# Patient Record
Sex: Male | Born: 1990 | Race: Black or African American | Hispanic: No | Marital: Single | Smoking: Current some day smoker
Health system: Southern US, Community
[De-identification: ages and names within clinical notes are randomized; demographics above are authoritative.]

## PROBLEM LIST (undated history)

## (undated) DIAGNOSIS — W3400XA Accidental discharge from unspecified firearms or gun, initial encounter: Secondary | ICD-10-CM

---
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
## 2013-05-15 DIAGNOSIS — W3400XA Accidental discharge from unspecified firearms or gun, initial encounter: Secondary | ICD-10-CM

## 2013-05-15 DIAGNOSIS — Y249XXA Unspecified firearm discharge, undetermined intent, initial encounter: Secondary | ICD-10-CM

## 2013-05-15 HISTORY — DX: Accidental discharge from unspecified firearms or gun, initial encounter: W34.00XA

## 2013-05-15 HISTORY — DX: Unspecified firearm discharge, undetermined intent, initial encounter: Y24.9XXA

## 2013-05-28 ENCOUNTER — Emergency Department (HOSPITAL_COMMUNITY): Payer: Medicaid Other | Admitting: Anesthesiology

## 2013-05-28 ENCOUNTER — Encounter (HOSPITAL_COMMUNITY): Payer: Medicaid Other | Admitting: Anesthesiology

## 2013-05-28 ENCOUNTER — Inpatient Hospital Stay (HOSPITAL_COMMUNITY)
Admission: EM | Admit: 2013-05-28 | Discharge: 2013-05-31 | DRG: 494 | Disposition: A | Discharge status: Home Health | Payer: Medicaid Other | Attending: Orthopedic Surgery | Admitting: Orthopedic Surgery

## 2013-05-28 ENCOUNTER — Emergency Department (HOSPITAL_COMMUNITY): Payer: Medicaid Other

## 2013-05-28 ENCOUNTER — Encounter (HOSPITAL_COMMUNITY): Payer: Self-pay | Admitting: Emergency Medicine

## 2013-05-28 ENCOUNTER — Encounter (HOSPITAL_COMMUNITY)
Admission: EM | Disposition: A | Discharge status: Home Health | Payer: Self-pay | Source: Home / Self Care | Attending: Orthopedic Surgery

## 2013-05-28 DIAGNOSIS — S82253A Displaced comminuted fracture of shaft of unspecified tibia, initial encounter for closed fracture: Secondary | ICD-10-CM

## 2013-05-28 DIAGNOSIS — F17200 Nicotine dependence, unspecified, uncomplicated: Secondary | ICD-10-CM | POA: Diagnosis present

## 2013-05-28 DIAGNOSIS — W3400XA Accidental discharge from unspecified firearms or gun, initial encounter: Secondary | ICD-10-CM

## 2013-05-28 DIAGNOSIS — S82209A Unspecified fracture of shaft of unspecified tibia, initial encounter for closed fracture: Secondary | ICD-10-CM | POA: Diagnosis present

## 2013-05-28 DIAGNOSIS — S82209B Unspecified fracture of shaft of unspecified tibia, initial encounter for open fracture type I or II: Principal | ICD-10-CM | POA: Diagnosis present

## 2013-05-28 DIAGNOSIS — S82839A Other fracture of upper and lower end of unspecified fibula, initial encounter for closed fracture: Secondary | ICD-10-CM | POA: Diagnosis present

## 2013-05-28 DIAGNOSIS — S82202B Unspecified fracture of shaft of left tibia, initial encounter for open fracture type I or II: Secondary | ICD-10-CM

## 2013-05-28 DIAGNOSIS — S8490XA Injury of unspecified nerve at lower leg level, unspecified leg, initial encounter: Secondary | ICD-10-CM

## 2013-05-28 HISTORY — PX: TIBIA IM NAIL INSERTION: SHX2516

## 2013-05-28 LAB — CBC WITH DIFFERENTIAL/PLATELET
Basophils Absolute: 0 10*3/uL (ref 0.0–0.1)
Basophils Relative: 0 % (ref 0–1)
EOS PCT: 1 % (ref 0–5)
Eosinophils Absolute: 0.1 10*3/uL (ref 0.0–0.7)
HCT: 40.3 % (ref 39.0–52.0)
HEMOGLOBIN: 13.2 g/dL (ref 13.0–17.0)
LYMPHS PCT: 60 % — AB (ref 12–46)
Lymphs Abs: 4.5 10*3/uL — ABNORMAL HIGH (ref 0.7–4.0)
MCH: 24.4 pg — ABNORMAL LOW (ref 26.0–34.0)
MCHC: 32.8 g/dL (ref 30.0–36.0)
MCV: 74.6 fL — ABNORMAL LOW (ref 78.0–100.0)
Monocytes Absolute: 0.4 10*3/uL (ref 0.1–1.0)
Monocytes Relative: 6 % (ref 3–12)
NEUTROS PCT: 33 % — AB (ref 43–77)
Neutro Abs: 2.4 10*3/uL (ref 1.7–7.7)
Platelets: 43 10*3/uL — ABNORMAL LOW (ref 150–400)
RBC: 5.4 MIL/uL (ref 4.22–5.81)
RDW: 14.6 % (ref 11.5–15.5)
WBC: 7.4 10*3/uL (ref 4.0–10.5)

## 2013-05-28 LAB — POCT I-STAT EG7
ACID-BASE DEFICIT: 4 mmol/L — AB (ref 0.0–2.0)
BICARBONATE: 20.6 meq/L (ref 20.0–24.0)
Calcium, Ion: 1.18 mmol/L (ref 1.12–1.23)
HCT: 29 % — ABNORMAL LOW (ref 39.0–52.0)
Hemoglobin: 9.9 g/dL — ABNORMAL LOW (ref 13.0–17.0)
O2 Saturation: 100 %
Patient temperature: 35.7
Potassium: 3.9 mEq/L (ref 3.7–5.3)
SODIUM: 142 meq/L (ref 137–147)
TCO2: 22 mmol/L (ref 0–100)
pCO2, Ven: 31.3 mmHg — ABNORMAL LOW (ref 45.0–50.0)
pH, Ven: 7.421 — ABNORMAL HIGH (ref 7.250–7.300)
pO2, Ven: 203 mmHg — ABNORMAL HIGH (ref 30.0–45.0)

## 2013-05-28 LAB — BASIC METABOLIC PANEL
BUN: 9 mg/dL (ref 6–23)
CO2: 18 mEq/L — ABNORMAL LOW (ref 19–32)
Calcium: 9.4 mg/dL (ref 8.4–10.5)
Chloride: 103 mEq/L (ref 96–112)
Creatinine, Ser: 0.89 mg/dL (ref 0.50–1.35)
Glucose, Bld: 126 mg/dL — ABNORMAL HIGH (ref 70–99)
POTASSIUM: 3.8 meq/L (ref 3.7–5.3)
Sodium: 144 mEq/L (ref 137–147)

## 2013-05-28 LAB — ETHANOL: Alcohol, Ethyl (B): 122 mg/dL — ABNORMAL HIGH (ref 0–11)

## 2013-05-28 LAB — RAPID URINE DRUG SCREEN, HOSP PERFORMED
AMPHETAMINES: NOT DETECTED
Barbiturates: NOT DETECTED
Benzodiazepines: NOT DETECTED
COCAINE: NOT DETECTED
OPIATES: NOT DETECTED
TETRAHYDROCANNABINOL: NOT DETECTED

## 2013-05-28 SURGERY — INSERTION, INTRAMEDULLARY ROD, TIBIA
Anesthesia: General | Site: Leg Lower | Laterality: Left

## 2013-05-28 MED ORDER — GLYCOPYRROLATE 0.2 MG/ML IJ SOLN
INTRAMUSCULAR | Status: AC
  Filled 2013-05-28: qty 2

## 2013-05-28 MED ORDER — DIPHENHYDRAMINE HCL 50 MG/ML IJ SOLN: 12.5000 mg | Freq: Four times a day (QID) | INTRAMUSCULAR | Status: DC | PRN

## 2013-05-28 MED ORDER — LIDOCAINE HCL (CARDIAC) 20 MG/ML IV SOLN
INTRAVENOUS | Status: DC | PRN
  Administered 2013-05-28: 100 mg via INTRAVENOUS

## 2013-05-28 MED ORDER — PHENYLEPHRINE 40 MCG/ML (10ML) SYRINGE FOR IV PUSH (FOR BLOOD PRESSURE SUPPORT)
PREFILLED_SYRINGE | INTRAVENOUS | Status: AC
  Filled 2013-05-28: qty 10

## 2013-05-28 MED ORDER — PROPOFOL 10 MG/ML IV BOLUS
INTRAVENOUS | Status: DC | PRN
  Administered 2013-05-28: 100 mg via INTRAVENOUS

## 2013-05-28 MED ORDER — VECURONIUM BROMIDE 10 MG IV SOLR
INTRAVENOUS | Status: AC
  Filled 2013-05-28: qty 10

## 2013-05-28 MED ORDER — POTASSIUM CHLORIDE IN NACL 20-0.45 MEQ/L-% IV SOLN
INTRAVENOUS | Status: DC
  Administered 2013-05-28: 12:00:00 via INTRAVENOUS
  Administered 2013-05-28 – 2013-05-29 (×2): 85 mL/h via INTRAVENOUS
  Filled 2013-05-28 (×8): qty 1000

## 2013-05-28 MED ORDER — VECURONIUM BROMIDE 10 MG IV SOLR
INTRAVENOUS | Status: DC | PRN
  Administered 2013-05-28: 4 mg via INTRAVENOUS

## 2013-05-28 MED ORDER — PHENYLEPHRINE HCL 10 MG/ML IJ SOLN
INTRAMUSCULAR | Status: DC | PRN
  Administered 2013-05-28: 40 ug via INTRAVENOUS
  Administered 2013-05-28: 80 ug via INTRAVENOUS
  Administered 2013-05-28 (×8): 40 ug via INTRAVENOUS

## 2013-05-28 MED ORDER — OXYCODONE-ACETAMINOPHEN 5-325 MG PO TABS
1.0000 | ORAL_TABLET | ORAL | Status: DC | PRN
  Administered 2013-05-28 – 2013-05-29 (×3): 2 via ORAL
  Administered 2013-05-29: 1 via ORAL
  Administered 2013-05-30 – 2013-05-31 (×4): 2 via ORAL
  Filled 2013-05-28 (×3): qty 2
  Filled 2013-05-28: qty 1
  Filled 2013-05-28 (×3): qty 2

## 2013-05-28 MED ORDER — ROCURONIUM BROMIDE 50 MG/5ML IV SOLN
INTRAVENOUS | Status: AC
  Filled 2013-05-28: qty 1

## 2013-05-28 MED ORDER — DOCUSATE SODIUM 100 MG PO CAPS
100.0000 mg | ORAL_CAPSULE | Freq: Two times a day (BID) | ORAL | Status: DC
  Administered 2013-05-28 – 2013-05-31 (×7): 100 mg via ORAL
  Filled 2013-05-28 (×9): qty 1

## 2013-05-28 MED ORDER — ONDANSETRON HCL 4 MG/2ML IJ SOLN: 4.0000 mg | Freq: Four times a day (QID) | INTRAMUSCULAR | Status: DC | PRN

## 2013-05-28 MED ORDER — DEXAMETHASONE SODIUM PHOSPHATE 4 MG/ML IJ SOLN
INTRAMUSCULAR | Status: DC | PRN
  Administered 2013-05-28: 4 mg via INTRAVENOUS

## 2013-05-28 MED ORDER — FLEET ENEMA 7-19 GM/118ML RE ENEM: 1.0000 | ENEMA | Freq: Once | RECTAL | Status: AC | PRN

## 2013-05-28 MED ORDER — HYDROMORPHONE HCL PF 1 MG/ML IJ SOLN: 0.2500 mg | INTRAMUSCULAR | Status: DC | PRN

## 2013-05-28 MED ORDER — FENTANYL CITRATE 0.05 MG/ML IJ SOLN
50.0000 ug | Freq: Once | INTRAMUSCULAR | Status: AC
  Administered 2013-05-28: 50 ug via INTRAVENOUS

## 2013-05-28 MED ORDER — DIPHENHYDRAMINE HCL 12.5 MG/5ML PO ELIX: 12.5000 mg | ORAL_SOLUTION | Freq: Four times a day (QID) | ORAL | Status: DC | PRN

## 2013-05-28 MED ORDER — CEFAZOLIN SODIUM 1-5 GM-% IV SOLN
1.0000 g | Freq: Once | INTRAVENOUS | Status: AC
  Administered 2013-05-28: 1 g via INTRAVENOUS
  Filled 2013-05-28: qty 50

## 2013-05-28 MED ORDER — MEPERIDINE HCL 25 MG/ML IJ SOLN: 6.2500 mg | INTRAMUSCULAR | Status: DC | PRN

## 2013-05-28 MED ORDER — METOCLOPRAMIDE HCL 10 MG PO TABS: 5.0000 mg | ORAL_TABLET | Freq: Three times a day (TID) | ORAL | Status: DC | PRN

## 2013-05-28 MED ORDER — CEFAZOLIN SODIUM-DEXTROSE 2-3 GM-% IV SOLR
INTRAVENOUS | Status: AC
  Filled 2013-05-28: qty 50

## 2013-05-28 MED ORDER — HYDROMORPHONE HCL PF 1 MG/ML IJ SOLN
1.0000 mg | Freq: Once | INTRAMUSCULAR | Status: AC
  Administered 2013-05-28: 1 mg via INTRAVENOUS
  Filled 2013-05-28: qty 1

## 2013-05-28 MED ORDER — PROPOFOL 10 MG/ML IV BOLUS
INTRAVENOUS | Status: AC
  Filled 2013-05-28: qty 20

## 2013-05-28 MED ORDER — CEFAZOLIN SODIUM 1-5 GM-% IV SOLN
1.0000 g | Freq: Three times a day (TID) | INTRAVENOUS | Status: AC
  Administered 2013-05-28 – 2013-05-29 (×3): 1 g via INTRAVENOUS
  Filled 2013-05-28 (×4): qty 50

## 2013-05-28 MED ORDER — SUCCINYLCHOLINE CHLORIDE 20 MG/ML IJ SOLN
INTRAMUSCULAR | Status: DC | PRN
  Administered 2013-05-28: 140 mg via INTRAVENOUS

## 2013-05-28 MED ORDER — BLISTEX MEDICATED EX OINT
TOPICAL_OINTMENT | CUTANEOUS | Status: DC | PRN
  Filled 2013-05-28: qty 10

## 2013-05-28 MED ORDER — ONDANSETRON HCL 4 MG/2ML IJ SOLN: 4.0000 mg | Freq: Once | INTRAMUSCULAR | Status: DC | PRN

## 2013-05-28 MED ORDER — ONDANSETRON HCL 4 MG/2ML IJ SOLN
4.0000 mg | Freq: Once | INTRAMUSCULAR | Status: AC
  Administered 2013-05-28: 4 mg via INTRAVENOUS

## 2013-05-28 MED ORDER — METOCLOPRAMIDE HCL 5 MG/ML IJ SOLN: 5.0000 mg | Freq: Three times a day (TID) | INTRAMUSCULAR | Status: DC | PRN

## 2013-05-28 MED ORDER — FENTANYL CITRATE 0.05 MG/ML IJ SOLN
INTRAMUSCULAR | Status: AC
  Filled 2013-05-28: qty 5

## 2013-05-28 MED ORDER — BUPIVACAINE-EPINEPHRINE (PF) 0.25% -1:200000 IJ SOLN
INTRAMUSCULAR | Status: AC
  Filled 2013-05-28: qty 30

## 2013-05-28 MED ORDER — MORPHINE SULFATE (PF) 1 MG/ML IV SOLN
INTRAVENOUS | Status: AC
  Filled 2013-05-28: qty 25

## 2013-05-28 MED ORDER — SUCCINYLCHOLINE CHLORIDE 20 MG/ML IJ SOLN
INTRAMUSCULAR | Status: AC
  Filled 2013-05-28: qty 1

## 2013-05-28 MED ORDER — CEFAZOLIN SODIUM-DEXTROSE 2-3 GM-% IV SOLR
INTRAVENOUS | Status: DC | PRN
  Administered 2013-05-28: 2 g via INTRAVENOUS

## 2013-05-28 MED ORDER — POLYETHYLENE GLYCOL 3350 17 G PO PACK: 17.0000 g | PACK | Freq: Every day | ORAL | Status: DC | PRN

## 2013-05-28 MED ORDER — LIDOCAINE HCL (CARDIAC) 20 MG/ML IV SOLN
INTRAVENOUS | Status: AC
  Filled 2013-05-28: qty 5

## 2013-05-28 MED ORDER — BUPIVACAINE HCL (PF) 0.25 % IJ SOLN
INTRAMUSCULAR | Status: AC
  Filled 2013-05-28: qty 30

## 2013-05-28 MED ORDER — ALBUMIN HUMAN 5 % IV SOLN
INTRAVENOUS | Status: DC | PRN
  Administered 2013-05-28: 07:00:00 via INTRAVENOUS

## 2013-05-28 MED ORDER — NALOXONE HCL 0.4 MG/ML IJ SOLN: 0.4000 mg | INTRAMUSCULAR | Status: DC | PRN

## 2013-05-28 MED ORDER — OXYCODONE HCL 5 MG PO TABS: 5.0000 mg | ORAL_TABLET | Freq: Once | ORAL | Status: DC | PRN

## 2013-05-28 MED ORDER — MIDAZOLAM HCL 2 MG/2ML IJ SOLN
INTRAMUSCULAR | Status: AC
  Filled 2013-05-28: qty 2

## 2013-05-28 MED ORDER — SODIUM CHLORIDE 0.9 % IJ SOLN
INTRAMUSCULAR | Status: AC
  Filled 2013-05-28: qty 10

## 2013-05-28 MED ORDER — SODIUM CHLORIDE 0.9 % IV SOLN
Freq: Once | INTRAVENOUS | Status: AC
  Administered 2013-05-28: 150 mL/h via INTRAVENOUS

## 2013-05-28 MED ORDER — NEOSTIGMINE METHYLSULFATE 1 MG/ML IJ SOLN
INTRAMUSCULAR | Status: DC | PRN
  Administered 2013-05-28: 4 mg via INTRAVENOUS

## 2013-05-28 MED ORDER — METHOCARBAMOL 500 MG PO TABS
500.0000 mg | ORAL_TABLET | Freq: Four times a day (QID) | ORAL | Status: DC | PRN
  Administered 2013-05-28 – 2013-05-31 (×7): 500 mg via ORAL
  Filled 2013-05-28 (×8): qty 1

## 2013-05-28 MED ORDER — GLYCOPYRROLATE 0.2 MG/ML IJ SOLN
INTRAMUSCULAR | Status: DC | PRN
  Administered 2013-05-28: .4 mg via INTRAVENOUS

## 2013-05-28 MED ORDER — MIDAZOLAM HCL 5 MG/5ML IJ SOLN
INTRAMUSCULAR | Status: DC | PRN
  Administered 2013-05-28: 2 mg via INTRAVENOUS

## 2013-05-28 MED ORDER — DEXAMETHASONE SODIUM PHOSPHATE 4 MG/ML IJ SOLN
INTRAMUSCULAR | Status: AC
  Filled 2013-05-28: qty 1

## 2013-05-28 MED ORDER — ONDANSETRON HCL 4 MG PO TABS: 4.0000 mg | ORAL_TABLET | Freq: Four times a day (QID) | ORAL | Status: DC | PRN

## 2013-05-28 MED ORDER — NEOSTIGMINE METHYLSULFATE 1 MG/ML IJ SOLN
INTRAMUSCULAR | Status: AC
  Filled 2013-05-28: qty 10

## 2013-05-28 MED ORDER — OXYCODONE HCL 5 MG/5ML PO SOLN: 5.0000 mg | Freq: Once | ORAL | Status: DC | PRN

## 2013-05-28 MED ORDER — SODIUM CHLORIDE 0.9 % IV BOLUS (SEPSIS)
1000.0000 mL | Freq: Once | INTRAVENOUS | Status: AC
  Administered 2013-05-28: 1000 mL via INTRAVENOUS

## 2013-05-28 MED ORDER — SODIUM CHLORIDE 0.9 % IJ SOLN: 9.0000 mL | INTRAMUSCULAR | Status: DC | PRN

## 2013-05-28 MED ORDER — FENTANYL CITRATE 0.05 MG/ML IJ SOLN
INTRAMUSCULAR | Status: DC | PRN
  Administered 2013-05-28: 100 ug via INTRAVENOUS
  Administered 2013-05-28: 150 ug via INTRAVENOUS

## 2013-05-28 MED ORDER — METHOCARBAMOL 100 MG/ML IJ SOLN
500.0000 mg | Freq: Four times a day (QID) | INTRAVENOUS | Status: DC | PRN
  Filled 2013-05-28: qty 5

## 2013-05-28 MED ORDER — SODIUM CHLORIDE 0.9 % IR SOLN
Status: DC | PRN
  Administered 2013-05-28: 3000 mL

## 2013-05-28 MED ORDER — ONDANSETRON HCL 4 MG/2ML IJ SOLN
INTRAMUSCULAR | Status: DC | PRN
Start: 2013-05-28 — End: 2013-05-28
  Administered 2013-05-28: 4 mg via INTRAVENOUS

## 2013-05-28 MED ORDER — MORPHINE SULFATE (PF) 1 MG/ML IV SOLN
INTRAVENOUS | Status: DC
  Administered 2013-05-28: 1.5 mg via INTRAVENOUS
  Administered 2013-05-28: 09:00:00 via INTRAVENOUS
  Administered 2013-05-28: 1.5 mg via INTRAVENOUS
  Administered 2013-05-29: 1.5 mL via INTRAVENOUS
  Administered 2013-05-29: 6.11 mg via INTRAVENOUS
  Administered 2013-05-29: 12:00:00 via INTRAVENOUS
  Administered 2013-05-29 (×3): 1.5 mg via INTRAVENOUS
  Administered 2013-05-29: 1.5 mL via INTRAVENOUS
  Administered 2013-05-29: 4.9 mg via INTRAVENOUS
  Administered 2013-05-30: 1.5 mg via INTRAVENOUS
  Filled 2013-05-28: qty 25

## 2013-05-28 MED ORDER — LACTATED RINGERS IV SOLN
INTRAVENOUS | Status: DC | PRN
  Administered 2013-05-28: 05:00:00 via INTRAVENOUS

## 2013-05-28 MED ORDER — ASPIRIN 81 MG PO CHEW
81.0000 mg | CHEWABLE_TABLET | Freq: Every day | ORAL | Status: DC
Start: 2013-05-28 — End: 2013-05-31
  Administered 2013-05-28 – 2013-05-31 (×4): 81 mg via ORAL
  Filled 2013-05-28 (×4): qty 1

## 2013-05-28 MED ORDER — BUPIVACAINE HCL (PF) 0.25 % IJ SOLN
INTRAMUSCULAR | Status: DC | PRN
  Administered 2013-05-28: 20 mL

## 2013-05-28 MED ORDER — ONDANSETRON HCL 4 MG/2ML IJ SOLN
INTRAMUSCULAR | Status: AC
  Filled 2013-05-28: qty 2

## 2013-05-28 SURGICAL SUPPLY — 71 items
BANDAGE ELASTIC 4 VELCRO ST LF (GAUZE/BANDAGES/DRESSINGS) ×3 IMPLANT
BANDAGE ELASTIC 6 VELCRO ST LF (GAUZE/BANDAGES/DRESSINGS) ×5 IMPLANT
BANDAGE ESMARK 6X9 LF (GAUZE/BANDAGES/DRESSINGS) IMPLANT
BANDAGE GAUZE ELAST BULKY 4 IN (GAUZE/BANDAGES/DRESSINGS) ×1 IMPLANT
BIT DRILL 3.8X6 NS (BIT) ×2 IMPLANT
BIT DRILL 4.4 NS (BIT) ×2 IMPLANT
BNDG CMPR 9X6 STRL LF SNTH (GAUZE/BANDAGES/DRESSINGS)
BNDG COHESIVE 4X5 TAN STRL (GAUZE/BANDAGES/DRESSINGS) ×1 IMPLANT
BNDG ESMARK 6X9 LF (GAUZE/BANDAGES/DRESSINGS)
COVER SURGICAL LIGHT HANDLE (MISCELLANEOUS) ×4 IMPLANT
CUFF TOURNIQUET SINGLE 34IN LL (TOURNIQUET CUFF) ×2 IMPLANT
CUFF TOURNIQUET SINGLE 44IN (TOURNIQUET CUFF) IMPLANT
DRAPE C-ARM 42X72 X-RAY (DRAPES) ×3 IMPLANT
DRAPE C-ARMOR (DRAPES) ×2 IMPLANT
DRAPE INCISE IOBAN 66X45 STRL (DRAPES) ×1 IMPLANT
DRAPE ORTHO SPLIT 77X108 STRL (DRAPES) ×6
DRAPE SURG ORHT 6 SPLT 77X108 (DRAPES) ×2 IMPLANT
DRAPE U-SHAPE 47X51 STRL (DRAPES) ×3 IMPLANT
DRSG ADAPTIC 3X8 NADH LF (GAUZE/BANDAGES/DRESSINGS) ×1 IMPLANT
DRSG PAD ABDOMINAL 8X10 ST (GAUZE/BANDAGES/DRESSINGS) ×2 IMPLANT
ELECT REM PT RETURN 9FT ADLT (ELECTROSURGICAL) ×3
ELECTRODE REM PT RTRN 9FT ADLT (ELECTROSURGICAL) ×1 IMPLANT
EVACUATOR 1/8 PVC DRAIN (DRAIN) IMPLANT
GAUZE XEROFORM 5X9 LF (GAUZE/BANDAGES/DRESSINGS) ×4 IMPLANT
GLOVE BIOGEL PI IND STRL 7.5 (GLOVE) ×1 IMPLANT
GLOVE BIOGEL PI IND STRL 8 (GLOVE) ×1 IMPLANT
GLOVE BIOGEL PI INDICATOR 7.5 (GLOVE) ×4
GLOVE BIOGEL PI INDICATOR 8 (GLOVE) ×2
GLOVE ORTHO TXT STRL SZ7.5 (GLOVE) ×3 IMPLANT
GLOVE SKINSENSE NS SZ8.0 LF (GLOVE)
GLOVE SKINSENSE STRL SZ8.0 LF (GLOVE) ×2 IMPLANT
GLOVE SURG ORTHO 8.0 STRL STRW (GLOVE) ×5 IMPLANT
GOWN STRL NON-REIN LRG LVL3 (GOWN DISPOSABLE) ×6 IMPLANT
GOWN STRL REIN 2XL XLG LVL4 (GOWN DISPOSABLE) ×3 IMPLANT
GUIDEWIRE BALL NOSE 80CM (WIRE) ×4 IMPLANT
KIT BASIN OR (CUSTOM PROCEDURE TRAY) ×3 IMPLANT
KIT ROOM TURNOVER OR (KITS) ×3 IMPLANT
NAIL TIBIAL 9MMX37.5CM (Nail) ×2 IMPLANT
NDL HYPO 25GX1X1/2 BEV (NEEDLE) IMPLANT
NEEDLE 22X1 1/2 (OR ONLY) (NEEDLE) ×2 IMPLANT
NEEDLE HYPO 25GX1X1/2 BEV (NEEDLE) ×3 IMPLANT
PACK ORTHO EXTREMITY (CUSTOM PROCEDURE TRAY) ×3 IMPLANT
PAD ARMBOARD 7.5X6 YLW CONV (MISCELLANEOUS) ×6 IMPLANT
PADDING CAST ABS 4INX4YD NS (CAST SUPPLIES) ×2
PADDING CAST ABS COTTON 4X4 ST (CAST SUPPLIES) IMPLANT
PADDING CAST COTTON 6X4 STRL (CAST SUPPLIES) ×2 IMPLANT
PIN GUIDE ACE (PIN) ×2 IMPLANT
SCREW ACECAP 42MM (Screw) ×2 IMPLANT
SCREW ACECAP 48MM (Screw) ×2 IMPLANT
SCREW PROXIMAL DEPUY (Screw) ×6 IMPLANT
SCREW PRXML FT 45X5.5XLCK NS (Screw) IMPLANT
SCREW PRXML FT 60X5.5XNS LF (Screw) IMPLANT
SPLINT PLASTER CAST XFAST 5X30 (CAST SUPPLIES) IMPLANT
SPLINT PLASTER XFAST SET 5X30 (CAST SUPPLIES) ×2
SPONGE GAUZE 4X4 12PLY (GAUZE/BANDAGES/DRESSINGS) ×3 IMPLANT
STAPLER VISISTAT 35W (STAPLE) IMPLANT
STOCKINETTE TUBULAR 6 INCH (GAUZE/BANDAGES/DRESSINGS) ×3 IMPLANT
SURGIFLO TRUKIT (HEMOSTASIS) IMPLANT
SUT PDS AB 1 CT  36 (SUTURE) ×2
SUT PDS AB 1 CT 36 (SUTURE) IMPLANT
SUT PROLENE 3 0 PS 2 (SUTURE) ×2 IMPLANT
SUT VIC AB 0 CT1 27 (SUTURE) ×3
SUT VIC AB 0 CT1 27XBRD ANBCTR (SUTURE) IMPLANT
SUT VIC AB 2-0 CT1 27 (SUTURE) ×6
SUT VIC AB 2-0 CT1 TAPERPNT 27 (SUTURE) IMPLANT
SYR CONTROL 10ML LL (SYRINGE) ×2 IMPLANT
TOWEL OR 17X24 6PK STRL BLUE (TOWEL DISPOSABLE) ×3 IMPLANT
TOWEL OR 17X26 10 PK STRL BLUE (TOWEL DISPOSABLE) ×3 IMPLANT
TUBE CONNECTING 12'X1/4 (SUCTIONS) ×1
TUBE CONNECTING 12X1/4 (SUCTIONS) ×2 IMPLANT
YANKAUER SUCT BULB TIP NO VENT (SUCTIONS) ×3 IMPLANT

## 2013-05-28 NOTE — ED Notes (Signed)
[facility], pt alert, NAD, calm, cooperative, skin W&D, resps e/u, speaking in clear complete sentences. MAEx4, cooperative. [facility] ev[facility].
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
## 2013-05-28 NOTE — ED Notes (Addendum)
[facility]. Belongings given to Ptika (gf). Preparing to go to OR. RN to give report face to face to OR. Last ate 1130am.
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
## 2013-05-28 NOTE — Anesthesia Procedure Notes (Signed)
Procedure Name: Intubation Date/Time: 05/28/2013 5:05 AM Performed by: Donn Zanetti S Pre-anesthesia Checklist: Patient identified, Timeout performed, Emergency Drugs available, Suction available and Patient being monitored Patient Re-evaluated:Patient Re-evaluated prior to inductionOxygen Delivery Method: Circle system utilized Preoxygenation: Pre-oxygenation with 100% oxygen Intubation Type: IV induction, Rapid sequence and Cricoid Pressure applied Ventilation: Mask ventilation without difficulty Laryngoscope Size: Mac and 4 Grade View: Grade I Tube type: Oral Tube size: 7.5 mm Number of attempts: 1 Airway Equipment and Method: Stylet Placement Confirmation: ETT inserted through vocal cords under direct vision,  positive ETCO2 and breath sounds checked- equal and bilateral Secured at: 23 cm Tube secured with: Tape Dental Injury: Teeth and Oropharynx as per pre-operative assessment

## 2013-05-28 NOTE — Transfer of Care (Signed)
Immediate Anesthesia Transfer of Care Note  Patient: Ricardo Wheeler  Procedure(s) Performed: Procedure(s): INTRAMEDULLARY (IM) NAIL TIBIAL (Left)  Patient Location: PACU  Anesthesia Type:General  Level of Consciousness: awake and alert   Airway & Oxygen Therapy: Patient Spontanous Breathing and Patient connected to nasal cannula oxygen  Post-op Assessment: Report given to PACU RN, Post -op Vital signs reviewed and stable and Patient moving all extremities X 4  Post vital signs: Reviewed and stable  Complications: No apparent anesthesia complications

## 2013-05-28 NOTE — ED Notes (Signed)
[facility], pain med given, pt updated.
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
## 2013-05-28 NOTE — Progress Notes (Signed)
Patients Mother, Olivia Mackieressa, voiced concern about visitors, requests that only visitors with Password be allowed to patient. Can contact her at [phone].
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
## 2013-05-28 NOTE — Anesthesia Postprocedure Evaluation (Signed)
  Anesthesia Post-op Note  Patient: Ricardo Wheeler  Procedure(s) Performed: Procedure(s): INTRAMEDULLARY (IM) NAIL TIBIAL (Left)  Patient Location: PACU  Anesthesia Type:General  Level of Consciousness: awake, alert , oriented and patient cooperative  Airway and Oxygen Therapy: Patient Spontanous Breathing and Patient connected to nasal cannula oxygen  Post-op Pain: mild  Post-op Assessment: Post-op Vital signs reviewed, Patient's Cardiovascular Status Stable, Respiratory Function Stable, Patent Airway, No signs of Nausea or vomiting and Pain level controlled  Post-op Vital Signs: Reviewed and stable  Complications: No apparent anesthesia complications

## 2013-05-28 NOTE — Preoperative (Signed)
Beta Blockers   Reason not to administer Beta Blockers:Not Applicable 

## 2013-05-28 NOTE — ED Notes (Signed)
Dr. Nanavati at [facility] ?

## 2013-05-29 LAB — BASIC METABOLIC PANEL
BUN: 10 mg/dL (ref 6–23)
CHLORIDE: 104 meq/L (ref 96–112)
CO2: 26 mEq/L (ref 19–32)
Calcium: 8.6 mg/dL (ref 8.4–10.5)
Creatinine, Ser: 0.95 mg/dL (ref 0.50–1.35)
GFR calc Af Amer: >90 mL/min (ref >90)
GFR calc non Af Amer: >90 mL/min (ref >90)
Glucose, Bld: 96 mg/dL (ref 70–99)
POTASSIUM: 3.9 meq/L (ref 3.7–5.3)
SODIUM: 140 meq/L (ref 137–147)

## 2013-05-29 NOTE — Progress Notes (Signed)
Patient complained of left leg tightness and tingling and statements of the PCA morphine not being 100% effective. On call doctor for Dr. Shon BatonBrooks notified. Ordered to give patient 1 Percocet and 1 Robaxin for pain management. Patient has feeling in his toes and capillary refill of less than 3 seconds. Will continue to assess.
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
## 2013-05-29 NOTE — Progress Notes (Signed)
Patient refused all morning labs

## 2013-05-29 NOTE — Progress Notes (Signed)
Patient ID: Ricardo Wheeler XXXRichmond, male   DOB: January 27, 1991, 23 y.o.   MRN: [id number] Subjective: 1 Day Post-Op Procedure(s) (LRB): INTRAMEDULLARY (IM) NAIL TIBIAL (Left)    Patient reports pain as mild in the current position with his leg propped up.  No complaints, worried about getting up  Objective:   VITALS:   Filed Vitals:   05/29/13 0806  BP:   Pulse:   Temp:   Resp: 13    Neurovascular intact distally Otherwise post-op splint intact  LABS  Recent Labs  05/28/13 0210 05/28/13 0622  HGB 13.2 9.9*  HCT 40.3 29.0*  WBC 7.4  --   PLT 43*  --      Recent Labs  05/28/13 0210 05/28/13 0622  NA 144 142  K 3.8 3.9  BUN 9  --   CREATININE 0.89  --   GLUCOSE 126*  --     No results found for this basename: LABPT, INR,  in the last 72 hours   Assessment/Plan: 1 Day Post-Op Procedure(s) (LRB): INTRAMEDULLARY (IM) NAIL TIBIAL (Left)   Up with therapy Discharge home with home health when cleared per therapy assessment

## 2013-05-30 ENCOUNTER — Inpatient Hospital Stay (HOSPITAL_COMMUNITY): Payer: Medicaid Other

## 2013-05-30 LAB — BASIC METABOLIC PANEL
BUN: 7 mg/dL (ref 6–23)
CO2: 28 meq/L (ref 19–32)
Calcium: 9.4 mg/dL (ref 8.4–10.5)
Chloride: 97 mEq/L (ref 96–112)
Creatinine, Ser: 0.86 mg/dL (ref 0.50–1.35)
GFR calc non Af Amer: >90 mL/min (ref >90)
Glucose, Bld: 128 mg/dL — ABNORMAL HIGH (ref 70–99)
Potassium: 3.8 mEq/L (ref 3.7–5.3)
SODIUM: 138 meq/L (ref 137–147)

## 2013-05-30 MED ORDER — OXYCODONE-ACETAMINOPHEN 5-325 MG PO TABS
1.0000 | ORAL_TABLET | Freq: Four times a day (QID) | ORAL | Status: DC | PRN
  Administered 2013-05-30 (×2): 2 via ORAL
  Filled 2013-05-30 (×3): qty 2

## 2013-05-30 MED ORDER — MORPHINE SULFATE 2 MG/ML IJ SOLN: 2.0000 mg | INTRAMUSCULAR | Status: DC | PRN

## 2013-05-30 MED ORDER — OXYCODONE HCL 5 MG PO TABS
5.0000 mg | ORAL_TABLET | Freq: Four times a day (QID) | ORAL | Status: DC | PRN
  Administered 2013-05-30 – 2013-05-31 (×4): 5 mg via ORAL
  Filled 2013-05-30 (×4): qty 1

## 2013-05-30 MED ORDER — ENOXAPARIN SODIUM 40 MG/0.4ML ~~LOC~~ SOLN
40.0000 mg | SUBCUTANEOUS | Status: DC
  Administered 2013-05-30 – 2013-05-31 (×2): 40 mg via SUBCUTANEOUS
  Filled 2013-05-30 (×2): qty 0.4
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
## 2013-05-30 NOTE — Progress Notes (Signed)
Orthopedic Tech Progress Note Patient Details:  Dorise HissKendall XXXRichmond Dec 17, 1990 [id number]  Ortho Devices Type of Ortho Device: Crutches Ortho Device/Splint Interventions: Adjustment   Cammer, Mickie BailJennifer Carol 05/30/2013, 3:40 PM

## 2013-05-30 NOTE — Progress Notes (Signed)
Orthopedic Tech Progress Note Patient Details:  Ricardo Wheeler Sep 07, 1990 [id number]  Ortho Devices Type of Ortho Device: Post (short leg) splint;Ace wrap Ortho Device/Splint Interventions: Application   Cammer, Mickie BailJennifer Carol 05/30/2013, 1:06 PM

## 2013-05-30 NOTE — Progress Notes (Signed)
    Subjective: 2 Days Post-Op Procedure(s) (LRB): INTRAMEDULLARY (IM) NAIL TIBIAL (Left) Patient reports pain as 5 on 0-10 scale.   Denies CP or SOB.  Voiding without difficulty. Positive flatus. Objective: Vital signs in last 24 hours: Temp:  [98.3 F (36.8 C)-99.9 F (37.7 C)] 98.3 F (36.8 C) (02/16 0547) Pulse Rate:  [82-89] 89 (02/16 0547) Resp:  [9-21] 16 (02/16 0547) BP: (133-153)/(68-71) 141/69 mmHg (02/16 0547) SpO2:  [99 %-100 %] 100 % (02/16 0547) Weight:  [74.39 kg (164 lb)] 74.39 kg (164 lb) (02/15 0900)  Intake/Output from previous day: 02/15 0701 - 02/16 0700 In: 240 [P.O.:240] Out: 1200 [Urine:1200] Intake/Output this shift:    Labs:  Recent Labs  05/28/13 0210 05/28/13 0622  HGB 13.2 9.9*    Recent Labs  05/28/13 0210 05/28/13 0622  WBC 7.4  --   RBC 5.40  --   HCT 40.3 29.0*  PLT 43*  --     Recent Labs  05/28/13 0210 05/28/13 0622 05/29/13 1010  NA 144 142 140  K 3.8 3.9 3.9  CL 103  --  104  CO2 18*  --  26  BUN 9  --  10  CREATININE 0.89  --  0.95  GLUCOSE 126*  --  96  CALCIUM 9.4  --  8.6   No results found for this basename: LABPT, INR,  in the last 72 hours  Physical Exam: Neurologically intact ABD soft Neurovascular intact Intact pulses distally Incision: no drainage Compartment soft  Assessment/Plan: 2 Days Post-Op Procedure(s) (LRB): INTRAMEDULLARY (IM) NAIL TIBIAL (Left) Advance diet Up with therapy Wheeler/C IV fluids Wounds healing - no sign of infection Continue mobilization Check xrays today  convert pain meds to oral.    Ricardo Wheeler for Dr. Venita Lickahari Daelon Wheeler Ricardo Wheeler [phone] 05/30/2013, 7:54 AM

## 2013-05-30 NOTE — Progress Notes (Signed)
Patient ID: Ricardo Wheeler, male   DOB: [birth year], 23 y.o.   MRN: [id number]   Pt seen this afternoon.  Lying in bed with girlfriend.  Ortho tech has not applied splint yet as ordered by Dr Shon BatonBrooks this morning.  Gabe RN paged ortho tech and I spoke with them.  Advised that splint needs to be applied asap.  Stated that she is coming up now.

## 2013-05-31 ENCOUNTER — Encounter (HOSPITAL_COMMUNITY): Payer: Self-pay | Admitting: Orthopedic Surgery

## 2013-05-31 LAB — CBC
HCT: 23.4 % — ABNORMAL LOW (ref 39.0–52.0)
HEMOGLOBIN: 7.7 g/dL — AB (ref 13.0–17.0)
MCH: 24.3 pg — AB (ref 26.0–34.0)
MCHC: 32.9 g/dL (ref 30.0–36.0)
MCV: 73.8 fL — AB (ref 78.0–100.0)
PLATELETS: 125 10*3/uL — AB (ref 150–400)
RBC: 3.17 MIL/uL — ABNORMAL LOW (ref 4.22–5.81)
RDW: 14.5 % (ref 11.5–15.5)
WBC: 6.2 10*3/uL (ref 4.0–10.5)

## 2013-05-31 LAB — BASIC METABOLIC PANEL
BUN: 11 mg/dL (ref 6–23)
CO2: 29 meq/L (ref 19–32)
Calcium: 9.5 mg/dL (ref 8.4–10.5)
Chloride: 100 mEq/L (ref 96–112)
Creatinine, Ser: 0.93 mg/dL (ref 0.50–1.35)
GFR calc Af Amer: >90 mL/min (ref >90)
GLUCOSE: 116 mg/dL — AB (ref 70–99)
POTASSIUM: 4.1 meq/L (ref 3.7–5.3)
Sodium: 141 mEq/L (ref 137–147)

## 2013-05-31 MED ORDER — METHOCARBAMOL 500 MG PO TABS: 500.0000 mg | ORAL_TABLET | Freq: Four times a day (QID) | ORAL | Status: DC | PRN

## 2013-05-31 MED ORDER — OXYCODONE-ACETAMINOPHEN 7.5-325 MG PO TABS: 1.0000 | ORAL_TABLET | Freq: Four times a day (QID) | ORAL | Status: DC | PRN

## 2013-05-31 MED ORDER — ENOXAPARIN SODIUM 40 MG/0.4ML ~~LOC~~ SOLN: 40.0000 mg | SUBCUTANEOUS | Status: DC

## 2013-05-31 MED ORDER — POLYETHYLENE GLYCOL 3350 17 G PO PACK: 17.0000 g | PACK | Freq: Every day | ORAL | Status: DC

## 2013-05-31 MED ORDER — ONDANSETRON 4 MG PO TBDP: 4.0000 mg | ORAL_TABLET | Freq: Three times a day (TID) | ORAL | Status: DC | PRN

## 2013-05-31 MED ORDER — DOCUSATE SODIUM 100 MG PO CAPS: 100.0000 mg | ORAL_CAPSULE | Freq: Two times a day (BID) | ORAL | Status: DC
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
## 2013-05-31 NOTE — Progress Notes (Signed)
PT Cancellation Note  Patient Details Name: Ricardo Wheeler MRN: [id number] DOB: November 30, 1990   Cancelled Treatment:    Reason Eval/Treat Not Completed: Other (comment)  Stair training completed last PT session  Checked in with pt to see if he had any concerns re: dc'ing home that we needed to address  Pt reports he is confident with crutches and has no questions re: managing at home  OK for dc home from Pt standpoint Thanks,  Van ClinesHolly Richie Bonanno, [place] [phone]    Van ClinesGarrigan, [place] [facility] 05/31/2013, 1:41 PM

## 2013-05-31 NOTE — Progress Notes (Signed)
Orthopedic Tech Progress Note Patient Details:  Ricardo Wheeler 22-Feb-1991 [id number]  Ortho Devices Type of Ortho Device: Stirrup splint;Post (short leg) splint;Ace wrap Ortho Device/Splint Interventions: Application   Cammer, Mickie BailJennifer Wheeler 05/31/2013, 2:58 PM

## 2013-05-31 NOTE — Discharge Instructions (Signed)
Cast or Splint Care °Casts and splints support injured limbs and keep bones from moving while they heal.  °HOME CARE °· Keep the cast or splint uncovered during the drying period. °· A plaster cast can take 24 to 48 hours to dry. °· A fiberglass cast will dry in less than 1 hour. °· Do not rest the cast on anything harder than a pillow for 24 hours. °· Do not put weight on your injured limb. Do not put pressure on the cast. Wait for your doctor's approval. °· Keep the cast or splint dry. °· Cover the cast or splint with a plastic bag during baths or wet weather. °· If you have a cast over your chest and belly (trunk), take sponge baths until the cast is taken off. °· If your cast gets wet, dry it with a towel or blow dryer. Use the cool setting on the blow dryer. °· Keep your cast or splint clean. Wash a dirty cast with a damp cloth. °· Do not put any objects under your cast or splint. °· Do not scratch the skin under the cast with an object. If itching is a problem, use a blow dryer on a cool setting over the itchy area. °· Do not trim or cut your cast. °· Do not take out the padding from inside your cast. °· Exercise your joints near the cast as told by your doctor. °· Raise (elevate) your injured limb on 1 or 2 pillows for the first 1 to 3 days. °GET HELP IF: °· Your cast or splint cracks. °· Your cast or splint is too tight or too loose. °· You itch badly under the cast. °· Your cast gets wet or has a soft spot. °· You have a bad smell coming from the cast. °· You get an object stuck under the cast. °· Your skin around the cast becomes red or sore. °· You have new or more pain after the cast is put on. °GET HELP RIGHT AWAY IF: °· You have fluid leaking through the cast. °· You cannot move your fingers or toes. °· Your fingers or toes turn blue or white or are cool, painful, or puffy (swollen). °· You have tingling or lose feeling (numbness) around the injured area. °· You have bad pain or pressure under the  cast. °· You have trouble breathing or have shortness of breath. °· You have chest pain. °Document Released: 07/31/2010 Document Revised: 12/01/2012 Document Reviewed: 10/07/2012 °Exit[employer]® Patient Information ©2014 [employer]. ° °

## 2013-05-31 NOTE — Progress Notes (Signed)
Subjective: Doing ok.  Pain controlled.  Ready to go home.     Objective: Vital signs in last 24 hours: Temp:  [97.8 F (36.6 C)-99 F (37.2 C)] 97.8 F (36.6 C) (02/17 0547) Pulse Rate:  [83-90] 90 (02/17 0547) Resp:  [18-20] 20 (02/17 0547) BP: (93-131)/(48-59) 93/48 mmHg (02/17 0547) SpO2:  [96 %-100 %] 100 % (02/17 0547)  Intake/Output from previous day: 02/16 0701 - 02/17 0700 In: 480 [P.O.:480] Out: 350 [Urine:350] Intake/Output this shift:     Recent Labs  05/31/13 0410  HGB 7.7*    Recent Labs  05/31/13 0410  WBC 6.2  RBC 3.17*  HCT 23.4*  PLT 125*    Recent Labs  05/30/13 0944 05/31/13 0410  NA 138 141  K 3.8 4.1  CL 97 100  CO2 28 29  BUN 7 11  CREATININE 0.86 0.93  GLUCOSE 128* 116*  CALCIUM 9.4 9.5   No results found for this basename: LABPT, INR,  in the last 72 hours  Exam:  Wounds look good.  No drainage or signs of infection.  Compartments soft.  NVI.  Dressing changed and new splint applied.    Assessment/Plan: D/c home today.  Scripts on chart for percocet, robaxin, lovenox x10 days and zofran.  F/u with dr Shon Batonbrooks in one week.  Return sooner if needed.     Ricardo Wheeler M 05/31/2013, 1:50 PM

## 2013-06-01 NOTE — Progress Notes (Signed)
Received Incoming call from [facility].Patients mother had called them as she reports she cant afford the Discharge  Medications which cost in excess of $700. This CM checked EPIC patient was discharged from the floor on 05/31/13.This CM stated to Financial services the patients mother [facility] and inquire with them  About there medication assistance.Per patients face sheet the patient does not have a PCP.This CM contacted patients Floor CM Vance PeperSusan Brady and made her aware of the call from the  Patients Mother to financial services.

## 2013-06-15 NOTE — Discharge Summary (Signed)
Agree with above 
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
## 2013-10-23 ENCOUNTER — Emergency Department (HOSPITAL_COMMUNITY): Payer: Medicaid Other

## 2013-10-23 ENCOUNTER — Emergency Department (HOSPITAL_COMMUNITY)
Admission: EM | Admit: 2013-10-23 | Discharge: 2013-10-23 | Disposition: A | Discharge status: Home / Self Care | Payer: Medicaid Other | Attending: Emergency Medicine | Admitting: Emergency Medicine

## 2013-10-23 ENCOUNTER — Encounter (HOSPITAL_COMMUNITY): Payer: Self-pay | Admitting: Emergency Medicine

## 2013-10-23 DIAGNOSIS — Z79899 Other long term (current) drug therapy: Secondary | ICD-10-CM | POA: Diagnosis not present

## 2013-10-23 DIAGNOSIS — M79609 Pain in unspecified limb: Secondary | ICD-10-CM | POA: Insufficient documentation

## 2013-10-23 DIAGNOSIS — G8918 Other acute postprocedural pain: Secondary | ICD-10-CM | POA: Insufficient documentation

## 2013-10-23 DIAGNOSIS — Z87828 Personal history of other (healed) physical injury and trauma: Secondary | ICD-10-CM | POA: Diagnosis not present

## 2013-10-23 DIAGNOSIS — Z8781 Personal history of (healed) traumatic fracture: Secondary | ICD-10-CM | POA: Diagnosis not present

## 2013-10-23 HISTORY — DX: Accidental discharge from unspecified firearms or gun, initial encounter: W34.00XA

## 2013-10-23 MED ORDER — HYDROCODONE-ACETAMINOPHEN 5-325 MG PO TABS: 1.0000 | ORAL_TABLET | Freq: Four times a day (QID) | ORAL | Status: DC | PRN
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
## 2013-10-23 NOTE — Discharge Instructions (Signed)
Her x-ray shows changes most likely associated with normal healing of her bone. There are changes however that are concerning for a chronic infection. You need to followup with Dr. Shon BatonBrooks for evaluation of this as infection of a bone can be a serious, and potentially limb-threatening, condition.
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
## 2015-10-14 ENCOUNTER — Encounter (HOSPITAL_COMMUNITY): Payer: Self-pay | Admitting: *Deleted

## 2015-10-14 ENCOUNTER — Emergency Department (HOSPITAL_COMMUNITY)
Admission: EM | Admit: 2015-10-14 | Discharge: 2015-10-14 | Disposition: A | Discharge status: Home / Self Care | Payer: Medicaid Other | Attending: Emergency Medicine | Admitting: Emergency Medicine

## 2015-10-14 DIAGNOSIS — Z202 Contact with and (suspected) exposure to infections with a predominantly sexual mode of transmission: Secondary | ICD-10-CM | POA: Insufficient documentation

## 2015-10-14 MED ORDER — AZITHROMYCIN 250 MG PO TABS
1000.0000 mg | ORAL_TABLET | Freq: Once | ORAL | Status: AC
  Administered 2015-10-14: 1000 mg via ORAL
  Filled 2015-10-14: qty 4

## 2015-10-14 MED ORDER — CEFTRIAXONE SODIUM 250 MG IJ SOLR
250.0000 mg | Freq: Once | INTRAMUSCULAR | Status: AC
  Administered 2015-10-14: 250 mg via INTRAMUSCULAR
  Filled 2015-10-14: qty 250

## 2015-10-14 MED ORDER — LIDOCAINE HCL 2 % IJ SOLN
INTRAMUSCULAR | Status: AC
  Administered 2015-10-14: 30 mg
  Filled 2015-10-14: qty 20
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
## 2015-10-14 NOTE — Discharge Instructions (Signed)
You were not tested for all STDs today. Your gonorrhea and chlamydia tests are pending- if they are positive, you will receive a phone call. Refrain from sex until you have the results from a full STD screen. Please go to [facility] (Address: [address] [place], [place] [address] Phone: [phone]) or see the [facility] (Address: [address] Phone: [phone]) for full STD screening. Return to the emergency room for worsening of symptoms, fever, and vomiting.  Do not hesitate to return to the emergency room for any new, worsening or concerning symptoms.  Please obtain primary care using resource guide below. Let them know that you were seen in the emergency room and that they will need to obtain records for further outpatient management.   [employer] 211 is a great source of information about community services available.  Access by dialing 2-1-1 from anywhere in [place], or by website -  PooledIncome.pl.   Other Local Resources (Updated 04/2015)  Financial Assistance   Services    Phone Number and Address  [facility]  Low-cost medical care - 1st and 3rd Saturday of every month  Must not qualify for public or private insurance and must have limited income [phone] [address] [place], [place]    [place] [employer] of Social Services  Child care  Emergency assistance for housing and [employer]  Medicaid [phone] [address] [place], [place] [address]   [facility]  Low-cost medical care for children, communicable diseases, sexually-transmitted diseases, immunizations, maternity care, womens health and family planning [phone] [address] [place], [place] [address]  [facility]   Medication assistance for [facility]  residents  Must meet income requirements [phone] [address] [place], [place].    [facility] Social Services  Child care  Emergency assistance for housing and [employer]  Medicaid [phone] [address] [place], [place] [address]  [facility]   Low-cost medical care,   Monday through Friday, 9 am to 6 pm.   Accepts Medicare/Medicaid, and self-pay [phone] [address] [place], [place] [address]  [facility]  Low-cost medical care - Monday through Friday, 8:30 am - 5:30 pm  Accepts Medicaid and self-pay [phone] [address] [place], [place] [address]   [facility]  Primary medical care, including for those with sickle cell disease  Accepts Medicare, Medicaid, insurance and self-pay [phone] [place], [place]  [facility]   Primary medical care  Accepts Medicare, [place], insurance and self-pay [phone] [address] [place], [place] [address]   [facility] Department of Social Services  Child care  Emergency assistance for housing and [employer]  Medicaid [phone] [place], [place] [address]  [facility] Department of Health and [employer]  Child care  Emergency assistance for housing and [employer]  Medicaid [phone] [address] [place], [place] [address]   [facility]  Medication assistance for [facility] residents with no insurance only  Must have a primary care doctor [phone] [address] [place], [place]  [facility]   Primary medical care  [place], [place], insurance  [phone] [address] [place], [place]  [facility]   Medication  assistance [phone]  [facility]   Primary  medical care  Accepts Medicare, Medicaid, insurance and self-pay [phone] [place], [place] [address]  [facility]   Primary medical care  Accepts Medicare, [place], insurance and self-pay [phone] [address] [place], [place] [address]  [facility]  For [place] residents between the ages of 7418 and 5264 who do not have any form of health insurance, Medicare, [place]icaid, or [place] benefits.  Services are provided free of charge to uninsured patients who fall within federal poverty guidelines.    Hours: Tuesdays and Thursdays, 4:15 - 8 pm [phone] [address] [place], [place] [address]  [facility]     Primary medical care  Dental care  Nutritional counseling  Pharmacy  Accepts Medicaid, Medicare, most insurance.  Fees are adjusted based on ability to pay.   [phone] [facility] [address] [place], [place]  [phone] [facility] [address] [place], [place]  [phone] [facility] [place], [place]  [phone] [facility], [address] [place], [place]  [phone] [facility] [address] [place], [place]  [facility]  Womens health and family planning [phone] [address] [place], [place]  [facility] Social Services  Child care  Emergency assistance for housing and [employer]ark[employer]ilities  Food stamps  Medicaid [phone] [address], [place], [place] [address]   [facility]    Ages 118 and older  Hours: Mondays and Thursdays, 7:00 am - 9:00 am Patients are seen on a first come, first served basis. [phone], ext. [phone] [place], [place]  [facility] Social Services  Child care  Emergency assistance for housing and [employer]ly[employer]arkutilities  Food stamps  Medicaid  [phone] [address] [place], [place] [address]  [facility]  Medication assistance  Rental assistance  Food pantry  Medication assistance  Housing assistance  Emergency food distribution  Utility assistance [phone] [address] [place], [place]  [phone]  [address] [place], [place] [address] Hours: Tuesdays and Thursdays from 9am - 12 noon by appointment only  [phone] [address] [place], [place] [address]  [facility] - Lanae Boastlara F. Gunn   Accepts private insurance, [place], and [place]icaid.  Payment is based on a sliding scale for those without insurance.  Hours: Mondays, Tuesdays and Thursdays, 8:30 am - 5:30 pm.   [phone] [address] [place], [place]  [facility] - Family Medicine at [facility]    Accepts private insurance, [place], and [place]icaid.  Payment is based on a sliding scale for those without insurance. [phone] [address] [place], [place]  [facility] - Pediatrics at E. Scientist, research (physical sciences)[place]  Accepts private insurance, [employer]Medicare, and [place]icaid.  Payment is based on a sliding scale for those without insurance [phone] [address], [place], [place]  [facility] - Pediatrics at [place] [facility]  Accepts private insurance, [place], and [place]icaid.  Payment is based on a sliding scale for those without insurance. [phone] [address] [place], [place]  [facility] - [facility]s at [facility] [place]  Accepts private insurance, [place], and [place]icaid.  Payment is based on a sliding scale for those without insurance. [phone], [phone]. [phone] [place], [place].    [facility]  Maternity  care.  Accepts Medicaid and self-pay. [phone] 7295 [address] [place], [place]
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
## 2015-10-15 LAB — GC/CHLAMYDIA PROBE AMP (~~LOC~~) NOT AT ARMC
Chlamydia: NEGATIVE
NEISSERIA GONORRHEA: NEGATIVE

## 2015-10-15 LAB — RPR: RPR Ser Ql: NONREACTIVE

## 2015-10-15 LAB — HIV ANTIBODY (ROUTINE TESTING W REFLEX): HIV Screen 4th Generation wRfx: NONREACTIVE

## 2016-01-07 ENCOUNTER — Encounter (HOSPITAL_COMMUNITY): Payer: Self-pay | Admitting: Emergency Medicine

## 2016-01-07 ENCOUNTER — Emergency Department (HOSPITAL_COMMUNITY)
Admission: EM | Admit: 2016-01-07 | Discharge: 2016-01-07 | Disposition: A | Discharge status: Home / Self Care | Payer: Medicaid Other | Attending: Emergency Medicine | Admitting: Emergency Medicine

## 2016-01-07 DIAGNOSIS — A64 Unspecified sexually transmitted disease: Secondary | ICD-10-CM | POA: Insufficient documentation

## 2016-01-07 DIAGNOSIS — N342 Other urethritis: Secondary | ICD-10-CM

## 2016-01-07 DIAGNOSIS — Z79899 Other long term (current) drug therapy: Secondary | ICD-10-CM | POA: Insufficient documentation

## 2016-01-07 LAB — URINALYSIS, ROUTINE W REFLEX MICROSCOPIC
BILIRUBIN URINE: NEGATIVE
Glucose, UA: NEGATIVE mg/dL
HGB URINE DIPSTICK: NEGATIVE
Ketones, ur: NEGATIVE mg/dL
Nitrite: NEGATIVE
PH: 6.5 (ref 5.0–8.0)
Protein, ur: NEGATIVE mg/dL
Specific Gravity, Urine: 1.023 (ref 1.005–1.030)

## 2016-01-07 LAB — URINE MICROSCOPIC-ADD ON: Squamous Epithelial / LPF: NONE SEEN

## 2016-01-07 MED ORDER — CIPROFLOXACIN HCL 500 MG PO TABS: 500.0000 mg | ORAL_TABLET | Freq: Two times a day (BID) | ORAL | 0 refills | Status: DC

## 2016-01-07 MED ORDER — CEFTRIAXONE SODIUM 250 MG IJ SOLR
250.0000 mg | Freq: Once | INTRAMUSCULAR | Status: AC
  Administered 2016-01-07: 250 mg via INTRAMUSCULAR
  Filled 2016-01-07: qty 250

## 2016-01-07 MED ORDER — LIDOCAINE HCL 1 % IJ SOLN
INTRAMUSCULAR | Status: AC
  Administered 2016-01-07: 1.2 mL
  Filled 2016-01-07: qty 20

## 2016-01-07 MED ORDER — AZITHROMYCIN 250 MG PO TABS
1000.0000 mg | ORAL_TABLET | Freq: Once | ORAL | Status: AC
  Administered 2016-01-07: 1000 mg via ORAL
  Filled 2016-01-07: qty 4

## 2016-01-07 NOTE — ED Notes (Signed)
Bed: WTR6 Expected date:  Expected time:  Means of arrival:  Comments: 

## 2016-01-07 NOTE — ED Notes (Signed)
PT DISCHARGED. INSTRUCTIONS AND PRESCRIPTION GIVEN. AAOX4. PT IN NO APPARENT DISTRESS OR PAIN. THE OPPORTUNITY TO ASK QUESTIONS WAS PROVIDED. 
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
## 2016-01-07 NOTE — ED Triage Notes (Signed)
Patient states that having dysuria starting this morning. patient states is having increase in frequency in urination as well. Patient also noticed green discharge.

## 2016-01-08 LAB — GC/CHLAMYDIA PROBE AMP (~~LOC~~) NOT AT ARMC
Chlamydia: POSITIVE — AB
Neisseria Gonorrhea: POSITIVE — AB

## 2016-01-08 LAB — HIV ANTIBODY (ROUTINE TESTING W REFLEX): HIV SCREEN 4TH GENERATION: NONREACTIVE

## 2016-01-09 ENCOUNTER — Telehealth (HOSPITAL_COMMUNITY): Payer: Self-pay

## 2016-01-09 NOTE — Telephone Encounter (Addendum)
Pt ID verified.  Pt informed of dx, tx rcvd approp., notify partner for testing and tx and abstain from sex x 10 days post treatment  Letter not sent.
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
## 2016-01-09 NOTE — Telephone Encounter (Signed)
LVM requesting callback.  Letter sent to EPIC address. 

## 2016-09-13 ENCOUNTER — Emergency Department (HOSPITAL_COMMUNITY): Payer: Self-pay

## 2016-09-13 ENCOUNTER — Emergency Department (HOSPITAL_COMMUNITY)
Admission: EM | Admit: 2016-09-13 | Discharge: 2016-09-13 | Disposition: A | Discharge status: Home / Self Care | Payer: Self-pay | Attending: Emergency Medicine | Admitting: Emergency Medicine

## 2016-09-13 ENCOUNTER — Encounter (HOSPITAL_COMMUNITY): Payer: Self-pay

## 2016-09-13 DIAGNOSIS — Y999 Unspecified external cause status: Secondary | ICD-10-CM | POA: Insufficient documentation

## 2016-09-13 DIAGNOSIS — Y929 Unspecified place or not applicable: Secondary | ICD-10-CM | POA: Insufficient documentation

## 2016-09-13 DIAGNOSIS — S01112A Laceration without foreign body of left eyelid and periocular area, initial encounter: Secondary | ICD-10-CM | POA: Insufficient documentation

## 2016-09-13 DIAGNOSIS — F17290 Nicotine dependence, other tobacco product, uncomplicated: Secondary | ICD-10-CM | POA: Insufficient documentation

## 2016-09-13 DIAGNOSIS — R51 Headache: Secondary | ICD-10-CM | POA: Insufficient documentation

## 2016-09-13 DIAGNOSIS — S0012XA Contusion of left eyelid and periocular area, initial encounter: Secondary | ICD-10-CM

## 2016-09-13 DIAGNOSIS — W500XXA Accidental hit or strike by another person, initial encounter: Secondary | ICD-10-CM | POA: Insufficient documentation

## 2016-09-13 DIAGNOSIS — Y9367 Activity, basketball: Secondary | ICD-10-CM | POA: Insufficient documentation

## 2016-09-13 MED ORDER — HYDROCODONE-ACETAMINOPHEN 5-325 MG PO TABS: 1.0000 | ORAL_TABLET | ORAL | 0 refills | Status: DC | PRN

## 2016-09-13 MED ORDER — OXYCODONE-ACETAMINOPHEN 5-325 MG PO TABS
ORAL_TABLET | ORAL | Status: AC
  Filled 2016-09-13: qty 1

## 2016-09-13 MED ORDER — OXYCODONE-ACETAMINOPHEN 5-325 MG PO TABS
1.0000 | ORAL_TABLET | ORAL | Status: DC | PRN
  Administered 2016-09-13: 1 via ORAL

## 2016-09-13 MED ORDER — FLUORESCEIN SODIUM 0.6 MG OP STRP
ORAL_STRIP | OPHTHALMIC | Status: AC
Start: 2016-09-13 — End: 2016-09-13
  Administered 2016-09-13: 1 via OPHTHALMIC
  Filled 2016-09-13: qty 1

## 2016-09-13 MED ORDER — TETRACAINE HCL 0.5 % OP SOLN
1.0000 [drp] | Freq: Once | OPHTHALMIC | Status: AC
  Administered 2016-09-13: 1 [drp] via OPHTHALMIC
  Filled 2016-09-13: qty 2

## 2016-09-13 MED ORDER — FLUORESCEIN-BENOXINATE 0.25-0.4 % OP SOLN
1.0000 [drp] | Freq: Once | OPHTHALMIC | Status: DC
  Filled 2016-09-13: qty 5

## 2016-09-13 MED ORDER — FLUORESCEIN SODIUM 0.6 MG OP STRP
1.0000 | ORAL_STRIP | Freq: Once | OPHTHALMIC | Status: AC
  Administered 2016-09-13: 1 via OPHTHALMIC

## 2016-09-13 NOTE — ED Notes (Signed)
ED Provider at bedside. 
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
## 2016-09-13 NOTE — Discharge Instructions (Signed)
Continue to ice the area several times a day Have stitches removed in 5 days Call eye doctor on Monday for follow up appointment Return for worsening symptoms
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
## 2016-09-13 NOTE — ED Notes (Signed)
Patient transported to CT 

## 2016-09-22 ENCOUNTER — Emergency Department (HOSPITAL_COMMUNITY)
Admission: EM | Admit: 2016-09-22 | Discharge: 2016-09-22 | Disposition: A | Discharge status: Home / Self Care | Payer: Self-pay | Attending: Emergency Medicine | Admitting: Emergency Medicine

## 2016-09-22 ENCOUNTER — Encounter (HOSPITAL_COMMUNITY): Payer: Self-pay | Admitting: *Deleted

## 2016-09-22 DIAGNOSIS — Z4802 Encounter for removal of sutures: Secondary | ICD-10-CM | POA: Insufficient documentation

## 2016-09-22 DIAGNOSIS — F17290 Nicotine dependence, other tobacco product, uncomplicated: Secondary | ICD-10-CM | POA: Insufficient documentation
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
## 2016-09-22 NOTE — ED Notes (Signed)
Sutures removed per Asher MuirJamie, GeorgiaPA-- pt not in room when this nurse went to discharge-- was given verbal instructions per PA.

## 2016-09-22 NOTE — Discharge Instructions (Signed)
Everything is healing well. Your sutures have been removed. Return to ER for new symptoms or any additional concerns.

## 2016-09-22 NOTE — ED Triage Notes (Signed)
Pt here for x 3 suture removal on L eyelid, no drainage, redness or swelling noted

## 2016-11-12 ENCOUNTER — Encounter (HOSPITAL_COMMUNITY): Payer: Self-pay | Admitting: Emergency Medicine

## 2016-11-12 ENCOUNTER — Ambulatory Visit (HOSPITAL_COMMUNITY)
Admission: EM | Admit: 2016-11-12 | Discharge: 2016-11-12 | Disposition: A | Discharge status: Home / Self Care | Payer: Self-pay | Attending: Family Medicine | Admitting: Family Medicine

## 2016-11-12 DIAGNOSIS — Z202 Contact with and (suspected) exposure to infections with a predominantly sexual mode of transmission: Secondary | ICD-10-CM | POA: Diagnosis not present

## 2016-11-12 DIAGNOSIS — Z113 Encounter for screening for infections with a predominantly sexual mode of transmission: Secondary | ICD-10-CM
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
## 2016-11-12 NOTE — ED Triage Notes (Signed)
PT would like to be checked for STDs. PT denies symptoms.

## 2016-11-12 NOTE — Discharge Instructions (Signed)
Your HIV test today could be falsely negative. Please return for recheck of HIV in about 1 month.

## 2016-11-13 ENCOUNTER — Emergency Department (HOSPITAL_COMMUNITY)
Admission: EM | Admit: 2016-11-13 | Discharge: 2016-11-13 | Disposition: A | Discharge status: Home / Self Care | Payer: Self-pay | Attending: Emergency Medicine | Admitting: Emergency Medicine

## 2016-11-13 ENCOUNTER — Encounter (HOSPITAL_COMMUNITY): Payer: Self-pay | Admitting: Emergency Medicine

## 2016-11-13 DIAGNOSIS — F17290 Nicotine dependence, other tobacco product, uncomplicated: Secondary | ICD-10-CM | POA: Insufficient documentation

## 2016-11-13 DIAGNOSIS — Z202 Contact with and (suspected) exposure to infections with a predominantly sexual mode of transmission: Secondary | ICD-10-CM | POA: Insufficient documentation

## 2016-11-13 LAB — HIV ANTIBODY (ROUTINE TESTING W REFLEX): HIV Screen 4th Generation wRfx: NONREACTIVE

## 2016-11-13 LAB — RPR: RPR: NONREACTIVE

## 2016-11-13 MED ORDER — LIDOCAINE HCL (PF) 1 % IJ SOLN
INTRAMUSCULAR | Status: AC
  Administered 2016-11-13: 5 mL
  Filled 2016-11-13: qty 5

## 2016-11-13 MED ORDER — CEFTRIAXONE SODIUM 250 MG IJ SOLR
250.0000 mg | Freq: Once | INTRAMUSCULAR | Status: AC
  Administered 2016-11-13: 250 mg via INTRAMUSCULAR
  Filled 2016-11-13: qty 250

## 2016-11-13 MED ORDER — AZITHROMYCIN 250 MG PO TABS
1000.0000 mg | ORAL_TABLET | Freq: Once | ORAL | Status: AC
  Administered 2016-11-13: 1000 mg via ORAL
  Filled 2016-11-13: qty 4

## 2016-11-13 NOTE — ED Triage Notes (Signed)
Pt st's he want's to be checked for all STD's.  Pt denies any symptoms.  Pt was seen at Urgent Care yesterday but st's they did not give him any medication,  Pt st's his sexual partner told him she had herpes.  Pt denies any symptoms of same

## 2016-11-13 NOTE — ED Notes (Signed)
Pt left because he is under house arrest .

## 2016-11-13 NOTE — ED Triage Notes (Signed)
Pt reports he can not stay because he is on house arrest and can not stay any longer. Pt  Instructed policy is to observe Pt for 30 mins. After an IM injection . Pt verbalizes he understands but still reports he can not stay
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
## 2016-11-13 NOTE — Discharge Instructions (Signed)
Today you were cultured for a sexually transmitted disease like chlamydia or gonorrhea. You have elected to be treated at this time as a precaution. You will receive a call in 48 hours if the test is positive. Please refrain from sexual activity for 48 hours. If the test is positive, please refrain from sexual activity for 10 days for the medicine to take effect and notify your sexual partners for the last 6 months as they, too, may want to be tested.   Because you have had unprotected sex, you may want to consider getting tested for HIV as well. Remember that latex condoms are the only way to prevent against STDs or HIV.  If you should develop severe or worsening pain in your abdomen or the pelvis or develop severe fevers,nausea or vomiting that prevent you from taking your medications, return to the emergency department immediately. Otherwise contact your local physician or county health department for a follow up appointment to complete STD testing including HIV and syphilis.  .  I have attached a handout on genital herpes. He develop any skin lesions, vesicles please see the health department or to be treated for this.  If you develop worsening or new concerning symptoms you can return to the emergency department for re-evaluation.

## 2016-11-14 LAB — URINE CYTOLOGY ANCILLARY ONLY
Chlamydia: NEGATIVE
Neisseria Gonorrhea: NEGATIVE

## 2016-11-14 LAB — GC/CHLAMYDIA PROBE AMP (~~LOC~~) NOT AT ARMC
Chlamydia: NEGATIVE
Neisseria Gonorrhea: NEGATIVE

## 2016-11-14 LAB — RPR: RPR Ser Ql: NONREACTIVE

## 2016-11-14 LAB — HIV ANTIBODY (ROUTINE TESTING W REFLEX): HIV SCREEN 4TH GENERATION: NONREACTIVE

## 2017-02-05 ENCOUNTER — Emergency Department (HOSPITAL_COMMUNITY)
Admission: EM | Admit: 2017-02-05 | Discharge: 2017-02-05 | Disposition: A | Discharge status: Home / Self Care | Payer: Medicaid Other | Attending: Emergency Medicine | Admitting: Emergency Medicine

## 2017-02-05 ENCOUNTER — Emergency Department (HOSPITAL_COMMUNITY): Payer: Medicaid Other

## 2017-02-05 ENCOUNTER — Encounter (HOSPITAL_COMMUNITY): Payer: Self-pay | Admitting: Emergency Medicine

## 2017-02-05 DIAGNOSIS — Y9389 Activity, other specified: Secondary | ICD-10-CM | POA: Insufficient documentation

## 2017-02-05 DIAGNOSIS — Y999 Unspecified external cause status: Secondary | ICD-10-CM | POA: Insufficient documentation

## 2017-02-05 DIAGNOSIS — W540XXA Bitten by dog, initial encounter: Secondary | ICD-10-CM | POA: Insufficient documentation

## 2017-02-05 DIAGNOSIS — S90511A Abrasion, right ankle, initial encounter: Secondary | ICD-10-CM | POA: Insufficient documentation

## 2017-02-05 DIAGNOSIS — S40811A Abrasion of right upper arm, initial encounter: Secondary | ICD-10-CM | POA: Insufficient documentation

## 2017-02-05 DIAGNOSIS — S01312A Laceration without foreign body of left ear, initial encounter: Secondary | ICD-10-CM | POA: Insufficient documentation

## 2017-02-05 DIAGNOSIS — Y929 Unspecified place or not applicable: Secondary | ICD-10-CM | POA: Insufficient documentation

## 2017-02-05 DIAGNOSIS — S91012A Laceration without foreign body, left ankle, initial encounter: Secondary | ICD-10-CM | POA: Insufficient documentation

## 2017-02-05 DIAGNOSIS — F17210 Nicotine dependence, cigarettes, uncomplicated: Secondary | ICD-10-CM | POA: Insufficient documentation

## 2017-02-05 MED ORDER — AMOXICILLIN-POT CLAVULANATE 875-125 MG PO TABS: 1.0000 | ORAL_TABLET | Freq: Two times a day (BID) | ORAL | 0 refills | Status: DC

## 2017-02-05 MED ORDER — IBUPROFEN 800 MG PO TABS
800.0000 mg | ORAL_TABLET | Freq: Once | ORAL | Status: AC
  Administered 2017-02-05: 800 mg via ORAL
  Filled 2017-02-05: qty 1

## 2017-02-05 MED ORDER — AMOXICILLIN-POT CLAVULANATE 875-125 MG PO TABS
1.0000 | ORAL_TABLET | Freq: Once | ORAL | Status: AC
  Administered 2017-02-05: 1 via ORAL
  Filled 2017-02-05: qty 1

## 2017-02-05 MED ORDER — LIDOCAINE HCL (PF) 1 % IJ SOLN
30.0000 mL | Freq: Once | INTRAMUSCULAR | Status: AC
  Administered 2017-02-05: 30 mL via INTRADERMAL
  Filled 2017-02-05: qty 30

## 2017-02-05 NOTE — ED Notes (Signed)
Pt returned to room from xray.

## 2017-02-05 NOTE — ED Notes (Signed)
Pt reports being bitten by a police dog. Pt complains of pain all over.

## 2017-02-05 NOTE — ED Triage Notes (Signed)
Pt BIB [facility] after being bitten by a dog. Wounds to inner left ankle, left ear, and small abrasion to right fourth toe. Bleeding controlled at this time. Dog's rabies vaccinations up to date.
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
## 2017-02-05 NOTE — Discharge Instructions (Signed)
You may alternate Tylenol 1000 mg every 6 hours as needed for pain and Ibuprofen 800 mg every 8 hours as needed for pain.  Please take Ibuprofen with food. ° °

## 2019-04-15 ENCOUNTER — Encounter (HOSPITAL_COMMUNITY): Payer: Self-pay | Admitting: Emergency Medicine

## 2019-04-15 ENCOUNTER — Emergency Department (HOSPITAL_COMMUNITY)
Admission: EM | Admit: 2019-04-15 | Discharge: 2019-04-15 | Disposition: A | Discharge status: Home / Self Care | Payer: Self-pay | Attending: Emergency Medicine | Admitting: Emergency Medicine

## 2019-04-15 DIAGNOSIS — Z5321 Procedure and treatment not carried out due to patient leaving prior to being seen by health care provider: Secondary | ICD-10-CM | POA: Insufficient documentation

## 2019-04-15 DIAGNOSIS — R0789 Other chest pain: Secondary | ICD-10-CM | POA: Insufficient documentation

## 2019-04-15 NOTE — ED Triage Notes (Signed)
Pt reports he took an ecstasy tablet last night and after going to sleep he woke up with a pain in the center of his chest. Pt denies any trouble breathing or related sxs. Pt is alert and ox4, in no distress.

## 2019-04-17 ENCOUNTER — Emergency Department (HOSPITAL_COMMUNITY)
Admission: EM | Admit: 2019-04-17 | Discharge: 2019-04-17 | Discharge status: Against Medical Advice | Payer: No Typology Code available for payment source | Attending: Emergency Medicine | Admitting: Emergency Medicine

## 2019-04-17 DIAGNOSIS — M79605 Pain in left leg: Secondary | ICD-10-CM | POA: Insufficient documentation

## 2019-04-17 DIAGNOSIS — R1084 Generalized abdominal pain: Secondary | ICD-10-CM | POA: Insufficient documentation

## 2019-04-17 DIAGNOSIS — R079 Chest pain, unspecified: Secondary | ICD-10-CM | POA: Diagnosis present

## 2019-04-17 DIAGNOSIS — Y92410 Unspecified street and highway as the place of occurrence of the external cause: Secondary | ICD-10-CM | POA: Diagnosis not present

## 2019-04-17 DIAGNOSIS — Y93I9 Activity, other involving external motion: Secondary | ICD-10-CM | POA: Diagnosis not present

## 2019-04-17 DIAGNOSIS — Y999 Unspecified external cause status: Secondary | ICD-10-CM | POA: Insufficient documentation

## 2019-04-17 DIAGNOSIS — F17210 Nicotine dependence, cigarettes, uncomplicated: Secondary | ICD-10-CM | POA: Insufficient documentation

## 2019-04-17 LAB — CBC
HCT: 45.6 % (ref 39.0–52.0)
Hemoglobin: 14.3 g/dL (ref 13.0–17.0)
MCH: 24.6 pg — ABNORMAL LOW (ref 26.0–34.0)
MCHC: 31.4 g/dL (ref 30.0–36.0)
MCV: 78.5 fL — ABNORMAL LOW (ref 80.0–100.0)
Platelets: 201 10*3/uL (ref 150–400)
RBC: 5.81 MIL/uL (ref 4.22–5.81)
RDW: 14.9 % (ref 11.5–15.5)
WBC: 7 10*3/uL (ref 4.0–10.5)
nRBC: 0 % (ref 0.0–0.2)

## 2019-04-17 LAB — COMPREHENSIVE METABOLIC PANEL
ALT: 18 U/L (ref 0–44)
AST: 36 U/L (ref 15–41)
Albumin: 4.3 g/dL (ref 3.5–5.0)
Alkaline Phosphatase: 51 U/L (ref 38–126)
Anion gap: 13 (ref 5–15)
BUN: 11 mg/dL (ref 6–20)
CO2: 23 mmol/L (ref 22–32)
Calcium: 9 mg/dL (ref 8.9–10.3)
Chloride: 109 mmol/L (ref 98–111)
Creatinine, Ser: 0.93 mg/dL (ref 0.61–1.24)
GFR calc Af Amer: >60 mL/min (ref >60)
GFR calc non Af Amer: >60 mL/min (ref >60)
Glucose, Bld: 107 mg/dL — ABNORMAL HIGH (ref 70–99)
Potassium: 3.6 mmol/L (ref 3.5–5.1)
Sodium: 145 mmol/L (ref 135–145)
Total Bilirubin: 0.3 mg/dL (ref 0.3–1.2)
Total Protein: 7.3 g/dL (ref 6.5–8.1)

## 2019-04-17 LAB — CDS SEROLOGY

## 2019-04-17 LAB — ETHANOL: Alcohol, Ethyl (B): 237 mg/dL — ABNORMAL HIGH (ref <10)

## 2019-04-17 LAB — I-STAT CHEM 8, ED
BUN: 12 mg/dL (ref 6–20)
Calcium, Ion: 1.07 mmol/L — ABNORMAL LOW (ref 1.15–1.40)
Chloride: 108 mmol/L (ref 98–111)
Creatinine, Ser: 1.2 mg/dL (ref 0.61–1.24)
Glucose, Bld: 104 mg/dL — ABNORMAL HIGH (ref 70–99)
HCT: 45 % (ref 39.0–52.0)
Hemoglobin: 15.3 g/dL (ref 13.0–17.0)
Potassium: 3.4 mmol/L — ABNORMAL LOW (ref 3.5–5.1)
Sodium: 144 mmol/L (ref 135–145)
TCO2: 24 mmol/L (ref 22–32)

## 2019-04-17 LAB — PROTIME-INR
INR: 0.9 (ref 0.8–1.2)
Prothrombin Time: 11.8 seconds (ref 11.4–15.2)

## 2019-04-17 MED ORDER — SODIUM CHLORIDE 0.9 % IV BOLUS
1000.0000 mL | Freq: Once | INTRAVENOUS | Status: AC
  Administered 2019-04-17: 1000 mL via INTRAVENOUS

## 2019-04-17 MED ORDER — SODIUM CHLORIDE 0.9 % IV SOLN: INTRAVENOUS | Status: DC
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
## 2019-04-17 NOTE — ED Notes (Signed)
Pt belongings placed in belongings bag at bedside. NT Overlea, and myself placed his money in the belongings bag.
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
## 2019-04-17 NOTE — ED Notes (Signed)
Pt upset his girlfriend couldn't come visit, pulled out IV and left AMA
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
## 2019-11-13 ENCOUNTER — Emergency Department (HOSPITAL_COMMUNITY)
Admission: EM | Admit: 2019-11-13 | Discharge: 2019-11-13 | Disposition: A | Discharge status: Home / Self Care | Payer: Self-pay | Attending: Emergency Medicine | Admitting: Emergency Medicine

## 2019-11-13 ENCOUNTER — Encounter (HOSPITAL_COMMUNITY): Payer: Self-pay | Admitting: Emergency Medicine

## 2019-11-13 ENCOUNTER — Other Ambulatory Visit: Payer: Self-pay

## 2019-11-13 DIAGNOSIS — R369 Urethral discharge, unspecified: Secondary | ICD-10-CM | POA: Insufficient documentation

## 2019-11-13 DIAGNOSIS — F17290 Nicotine dependence, other tobacco product, uncomplicated: Secondary | ICD-10-CM | POA: Insufficient documentation

## 2019-11-13 LAB — URINALYSIS, ROUTINE W REFLEX MICROSCOPIC
Bacteria, UA: NONE SEEN
Bilirubin Urine: NEGATIVE
Glucose, UA: NEGATIVE mg/dL
Hgb urine dipstick: NEGATIVE
Ketones, ur: NEGATIVE mg/dL
Nitrite: NEGATIVE
Protein, ur: NEGATIVE mg/dL
Specific Gravity, Urine: 1.019 (ref 1.005–1.030)
pH: 6 (ref 5.0–8.0)

## 2019-11-13 MED ORDER — AZITHROMYCIN 250 MG PO TABS
1000.0000 mg | ORAL_TABLET | Freq: Once | ORAL | Status: AC
  Administered 2019-11-13: 1000 mg via ORAL
  Filled 2019-11-13: qty 4

## 2019-11-13 MED ORDER — STERILE WATER FOR INJECTION IJ SOLN
INTRAMUSCULAR | Status: AC
  Administered 2019-11-13: 10 mL
  Filled 2019-11-13: qty 10

## 2019-11-13 MED ORDER — CEFTRIAXONE SODIUM 500 MG IJ SOLR
500.0000 mg | Freq: Once | INTRAMUSCULAR | Status: AC
  Administered 2019-11-13: 500 mg via INTRAMUSCULAR
  Filled 2019-11-13: qty 500

## 2019-11-13 NOTE — ED Notes (Signed)
Verbalized understanding of DC instructions and follow up care 

## 2019-11-13 NOTE — ED Triage Notes (Signed)
Pt requesting to be check for STDS
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
## 2019-11-13 NOTE — Discharge Instructions (Addendum)
You were seen in the ER for penile discharge  You were treated for STDs   Results for gonorrhea and chlamydia are pending, follow up on Mychart  Abstain from sex for 10 days notify partners of symptoms, they need testing and treatment  Return for urinary symptoms, testicular pain, fever, abdominal pain

## 2019-12-04 ENCOUNTER — Emergency Department (HOSPITAL_COMMUNITY): Payer: Medicaid Other

## 2019-12-04 ENCOUNTER — Emergency Department (HOSPITAL_COMMUNITY)
Admission: EM | Admit: 2019-12-04 | Discharge: 2019-12-04 | Disposition: A | Discharge status: Home / Self Care | Payer: Medicaid Other | Attending: Emergency Medicine | Admitting: Emergency Medicine

## 2019-12-04 ENCOUNTER — Other Ambulatory Visit: Payer: Self-pay

## 2019-12-04 ENCOUNTER — Encounter (HOSPITAL_COMMUNITY): Payer: Self-pay | Admitting: Emergency Medicine

## 2019-12-04 DIAGNOSIS — Y9389 Activity, other specified: Secondary | ICD-10-CM | POA: Insufficient documentation

## 2019-12-04 DIAGNOSIS — S51801A Unspecified open wound of right forearm, initial encounter: Secondary | ICD-10-CM | POA: Insufficient documentation

## 2019-12-04 DIAGNOSIS — F17290 Nicotine dependence, other tobacco product, uncomplicated: Secondary | ICD-10-CM | POA: Insufficient documentation

## 2019-12-04 DIAGNOSIS — Y999 Unspecified external cause status: Secondary | ICD-10-CM | POA: Insufficient documentation

## 2019-12-04 DIAGNOSIS — W3400XA Accidental discharge from unspecified firearms or gun, initial encounter: Secondary | ICD-10-CM | POA: Insufficient documentation

## 2019-12-04 DIAGNOSIS — Z79899 Other long term (current) drug therapy: Secondary | ICD-10-CM | POA: Insufficient documentation

## 2019-12-04 DIAGNOSIS — Z20822 Contact with and (suspected) exposure to covid-19: Secondary | ICD-10-CM | POA: Insufficient documentation

## 2019-12-04 DIAGNOSIS — S51802A Unspecified open wound of left forearm, initial encounter: Secondary | ICD-10-CM | POA: Insufficient documentation

## 2019-12-04 DIAGNOSIS — Y9289 Other specified places as the place of occurrence of the external cause: Secondary | ICD-10-CM | POA: Insufficient documentation

## 2019-12-04 LAB — BASIC METABOLIC PANEL
Anion gap: 15 (ref 5–15)
BUN: 16 mg/dL (ref 6–20)
CO2: 21 mmol/L — ABNORMAL LOW (ref 22–32)
Calcium: 9.5 mg/dL (ref 8.9–10.3)
Chloride: 104 mmol/L (ref 98–111)
Creatinine, Ser: 1.08 mg/dL (ref 0.61–1.24)
GFR calc Af Amer: >60 mL/min (ref >60)
GFR calc non Af Amer: >60 mL/min (ref >60)
Glucose, Bld: 125 mg/dL — ABNORMAL HIGH (ref 70–99)
Potassium: 3.2 mmol/L — ABNORMAL LOW (ref 3.5–5.1)
Sodium: 140 mmol/L (ref 135–145)

## 2019-12-04 LAB — CBC WITH DIFFERENTIAL/PLATELET
Abs Immature Granulocytes: 0.01 10*3/uL (ref 0.00–0.07)
Basophils Absolute: 0.1 10*3/uL (ref 0.0–0.1)
Basophils Relative: 1 %
Eosinophils Absolute: 0.3 10*3/uL (ref 0.0–0.5)
Eosinophils Relative: 3 %
HCT: 41.8 % (ref 39.0–52.0)
Hemoglobin: 13.3 g/dL (ref 13.0–17.0)
Immature Granulocytes: 0 %
Lymphocytes Relative: 59 %
Lymphs Abs: 5.8 10*3/uL — ABNORMAL HIGH (ref 0.7–4.0)
MCH: 25.1 pg — ABNORMAL LOW (ref 26.0–34.0)
MCHC: 31.8 g/dL (ref 30.0–36.0)
MCV: 79 fL — ABNORMAL LOW (ref 80.0–100.0)
Monocytes Absolute: 1 10*3/uL (ref 0.1–1.0)
Monocytes Relative: 10 %
Neutro Abs: 2.7 10*3/uL (ref 1.7–7.7)
Neutrophils Relative %: 27 %
Platelets: 200 10*3/uL (ref 150–400)
RBC: 5.29 MIL/uL (ref 4.22–5.81)
RDW: 14.3 % (ref 11.5–15.5)
WBC: 9.8 10*3/uL (ref 4.0–10.5)
nRBC: 0 % (ref 0.0–0.2)

## 2019-12-04 LAB — ABO/RH: ABO/RH(D): O POS

## 2019-12-04 LAB — TYPE AND SCREEN
ABO/RH(D): O POS
Antibody Screen: NEGATIVE

## 2019-12-04 LAB — PROTIME-INR
INR: 0.9 (ref 0.8–1.2)
Prothrombin Time: 12.2 seconds (ref 11.4–15.2)

## 2019-12-04 LAB — SARS CORONAVIRUS 2 BY RT PCR (HOSPITAL ORDER, PERFORMED IN ~~LOC~~ HOSPITAL LAB): SARS Coronavirus 2: NEGATIVE

## 2019-12-04 MED ORDER — ONDANSETRON HCL 4 MG/2ML IJ SOLN
4.0000 mg | Freq: Once | INTRAMUSCULAR | Status: AC
  Administered 2019-12-04: 4 mg via INTRAVENOUS
  Filled 2019-12-04: qty 2

## 2019-12-04 MED ORDER — MORPHINE SULFATE (PF) 4 MG/ML IV SOLN
6.0000 mg | Freq: Once | INTRAVENOUS | Status: AC
  Administered 2019-12-04: 6 mg via INTRAVENOUS
  Filled 2019-12-04: qty 2

## 2019-12-04 MED ORDER — MORPHINE SULFATE (PF) 4 MG/ML IV SOLN: 4.0000 mg | Freq: Once | INTRAVENOUS | Status: AC

## 2019-12-04 MED ORDER — MORPHINE SULFATE (PF) 4 MG/ML IV SOLN
4.0000 mg | Freq: Once | INTRAVENOUS | Status: AC
  Administered 2019-12-04: 4 mg via INTRAVENOUS
  Filled 2019-12-04: qty 1

## 2019-12-04 MED ORDER — MORPHINE SULFATE (PF) 4 MG/ML IV SOLN
INTRAVENOUS | Status: AC
  Administered 2019-12-04: 4 mg via INTRAVENOUS
  Filled 2019-12-04: qty 1

## 2019-12-04 MED ORDER — KETOROLAC TROMETHAMINE 15 MG/ML IJ SOLN
15.0000 mg | Freq: Once | INTRAMUSCULAR | Status: AC
  Administered 2019-12-04: 15 mg via INTRAVENOUS
  Filled 2019-12-04: qty 1

## 2019-12-04 MED ORDER — CEFAZOLIN SODIUM-DEXTROSE 1-4 GM/50ML-% IV SOLN
1.0000 g | Freq: Once | INTRAVENOUS | Status: AC
  Administered 2019-12-04: 1 g via INTRAVENOUS
  Filled 2019-12-04: qty 50

## 2019-12-04 MED ORDER — OXYCODONE-ACETAMINOPHEN 5-325 MG PO TABS: 1.0000 | ORAL_TABLET | Freq: Four times a day (QID) | ORAL | 0 refills | Status: DC | PRN

## 2019-12-04 MED ORDER — CEPHALEXIN 500 MG PO CAPS: 500.0000 mg | ORAL_CAPSULE | Freq: Four times a day (QID) | ORAL | 0 refills | Status: DC

## 2019-12-04 NOTE — Discharge Instructions (Signed)
Dr. Carollee Massed office will call you on Monday to arrange a follow-up appointment.  Leave dressing and splint in place until you are orthopedic surgery appointment.  Begin taking Keflex as prescribed.  Take Percocet as prescribed as needed for pain.  Keep your arm elevated as much as possible until followed up by orthopedics.
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
## 2019-12-04 NOTE — ED Notes (Addendum)
Patient is alert and oriented x4 alert and responsive. Pt. Currently talking on the phone.

## 2019-12-10 ENCOUNTER — Emergency Department (HOSPITAL_COMMUNITY)
Admission: EM | Admit: 2019-12-10 | Discharge: 2019-12-11 | Disposition: A | Discharge status: Home / Self Care | Payer: Medicaid Other | Attending: Emergency Medicine | Admitting: Emergency Medicine

## 2019-12-10 ENCOUNTER — Other Ambulatory Visit: Payer: Self-pay

## 2019-12-10 DIAGNOSIS — M79601 Pain in right arm: Secondary | ICD-10-CM | POA: Insufficient documentation

## 2019-12-10 DIAGNOSIS — Z5321 Procedure and treatment not carried out due to patient leaving prior to being seen by health care provider: Secondary | ICD-10-CM | POA: Insufficient documentation

## 2019-12-10 DIAGNOSIS — M79602 Pain in left arm: Secondary | ICD-10-CM | POA: Insufficient documentation
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
## 2019-12-11 NOTE — ED Notes (Signed)
Pt called x2 for vitals retake.  

## 2019-12-22 ENCOUNTER — Other Ambulatory Visit: Payer: Self-pay | Admitting: Orthopedic Surgery

## 2019-12-23 ENCOUNTER — Other Ambulatory Visit: Payer: Self-pay

## 2019-12-23 ENCOUNTER — Encounter (HOSPITAL_BASED_OUTPATIENT_CLINIC_OR_DEPARTMENT_OTHER): Payer: Self-pay | Admitting: Orthopedic Surgery

## 2019-12-23 ENCOUNTER — Inpatient Hospital Stay (HOSPITAL_COMMUNITY): Admission: RE | Admit: 2019-12-23 | Payer: Medicaid Other | Source: Ambulatory Visit
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
## 2019-12-24 ENCOUNTER — Other Ambulatory Visit (HOSPITAL_COMMUNITY)
Admission: RE | Admit: 2019-12-24 | Discharge: 2019-12-24 | Disposition: A | Discharge status: Home / Self Care | Payer: Medicaid Other | Source: Ambulatory Visit | Attending: Orthopedic Surgery | Admitting: Orthopedic Surgery

## 2019-12-24 DIAGNOSIS — Z20822 Contact with and (suspected) exposure to covid-19: Secondary | ICD-10-CM | POA: Insufficient documentation

## 2019-12-24 DIAGNOSIS — Z01818 Encounter for other preprocedural examination: Secondary | ICD-10-CM | POA: Insufficient documentation

## 2019-12-24 LAB — SARS CORONAVIRUS 2 (TAT 6-24 HRS): SARS Coronavirus 2: NEGATIVE

## 2019-12-25 NOTE — Anesthesia Preprocedure Evaluation (Addendum)
Anesthesia Evaluation  Patient identified by MRN, date of birth, ID band Patient awake    Reviewed: Allergy & Precautions, NPO status , Patient's Chart, lab work & pertinent test results  Airway Mallampati: II  TM Distance: >3 FB Neck ROM: Full    Dental no notable dental hx. (+) Teeth Intact   Pulmonary Current Smoker,    Pulmonary exam normal breath sounds clear to auscultation       Cardiovascular Exercise Tolerance: Good Normal cardiovascular exam Rhythm:Regular Rate:Normal     Neuro/Psych negative neurological ROS  negative psych ROS   GI/Hepatic negative GI ROS, Neg liver ROS,   Endo/Other  negative endocrine ROS  Renal/GU negative Renal ROS     Musculoskeletal negative musculoskeletal ROS (+)   Abdominal (+) + obese,   Peds negative pediatric ROS (+)  Hematology negative hematology ROS (+)   Anesthesia Other Findings   Reproductive/Obstetrics                            Anesthesia Physical Anesthesia Plan  ASA: II  Anesthesia Plan: General   Post-op Pain Management:    Induction: Intravenous  PONV Risk Score and Plan: 3 and Midazolam, Treatment may vary due to age or medical condition, Dexamethasone and Ondansetron  Airway Management Planned: LMA  Additional Equipment: None  Intra-op Plan:   Post-operative Plan:   Informed Consent: I have reviewed the patients History and Physical, chart, labs and discussed the procedure including the risks, benefits and alternatives for the proposed anesthesia with the patient or authorized representative who has indicated his/her understanding and acceptance.     Dental advisory given  Plan Discussed with: CRNA and Anesthesiologist  Anesthesia Plan Comments: (No Block )       Anesthesia Quick Evaluation

## 2019-12-26 ENCOUNTER — Other Ambulatory Visit: Payer: Self-pay

## 2019-12-26 ENCOUNTER — Ambulatory Visit (HOSPITAL_COMMUNITY): Payer: Self-pay

## 2019-12-26 ENCOUNTER — Encounter (HOSPITAL_BASED_OUTPATIENT_CLINIC_OR_DEPARTMENT_OTHER)
Admission: RE | Disposition: A | Discharge status: Home / Self Care | Payer: Self-pay | Source: Home / Self Care | Attending: Orthopedic Surgery

## 2019-12-26 ENCOUNTER — Ambulatory Visit (HOSPITAL_BASED_OUTPATIENT_CLINIC_OR_DEPARTMENT_OTHER)
Admission: RE | Admit: 2019-12-26 | Discharge: 2019-12-26 | Disposition: A | Discharge status: Home / Self Care | Payer: Self-pay | Attending: Orthopedic Surgery | Admitting: Orthopedic Surgery

## 2019-12-26 ENCOUNTER — Encounter (HOSPITAL_BASED_OUTPATIENT_CLINIC_OR_DEPARTMENT_OTHER): Payer: Self-pay | Admitting: Orthopedic Surgery

## 2019-12-26 ENCOUNTER — Ambulatory Visit (HOSPITAL_BASED_OUTPATIENT_CLINIC_OR_DEPARTMENT_OTHER): Payer: Self-pay | Admitting: Anesthesiology

## 2019-12-26 DIAGNOSIS — F17210 Nicotine dependence, cigarettes, uncomplicated: Secondary | ICD-10-CM | POA: Insufficient documentation

## 2019-12-26 DIAGNOSIS — Z419 Encounter for procedure for purposes other than remedying health state, unspecified: Secondary | ICD-10-CM

## 2019-12-26 DIAGNOSIS — W3400XA Accidental discharge from unspecified firearms or gun, initial encounter: Secondary | ICD-10-CM | POA: Insufficient documentation

## 2019-12-26 DIAGNOSIS — S5411XA Injury of median nerve at forearm level, right arm, initial encounter: Secondary | ICD-10-CM | POA: Insufficient documentation

## 2019-12-26 DIAGNOSIS — F17290 Nicotine dependence, other tobacco product, uncomplicated: Secondary | ICD-10-CM | POA: Insufficient documentation

## 2019-12-26 DIAGNOSIS — S52591A Other fractures of lower end of right radius, initial encounter for closed fracture: Secondary | ICD-10-CM | POA: Insufficient documentation

## 2019-12-26 HISTORY — PX: OPEN REDUCTION INTERNAL FIXATION (ORIF) DISTAL RADIAL FRACTURE: SHX5989

## 2019-12-26 SURGERY — OPEN REDUCTION INTERNAL FIXATION (ORIF) DISTAL RADIUS FRACTURE
Anesthesia: General | Site: Arm Lower | Laterality: Right

## 2019-12-26 MED ORDER — IBUPROFEN 200 MG PO TABS: 600.0000 mg | ORAL_TABLET | Freq: Four times a day (QID) | ORAL | 0 refills | Status: AC

## 2019-12-26 MED ORDER — CEFAZOLIN SODIUM-DEXTROSE 2-4 GM/100ML-% IV SOLN
INTRAVENOUS | Status: AC
  Filled 2019-12-26: qty 100

## 2019-12-26 MED ORDER — HYDROMORPHONE HCL 1 MG/ML IJ SOLN
0.2500 mg | INTRAMUSCULAR | Status: DC | PRN
  Administered 2019-12-26 (×4): 0.5 mg via INTRAVENOUS

## 2019-12-26 MED ORDER — CEFAZOLIN SODIUM-DEXTROSE 2-4 GM/100ML-% IV SOLN
2.0000 g | INTRAVENOUS | Status: AC
  Administered 2019-12-26: 2 g via INTRAVENOUS

## 2019-12-26 MED ORDER — FENTANYL CITRATE (PF) 100 MCG/2ML IJ SOLN
INTRAMUSCULAR | Status: AC
  Filled 2019-12-26: qty 2

## 2019-12-26 MED ORDER — LIDOCAINE HCL (PF) 1 % IJ SOLN
INTRAMUSCULAR | Status: AC
  Filled 2019-12-26: qty 30

## 2019-12-26 MED ORDER — EPHEDRINE SULFATE 50 MG/ML IJ SOLN
INTRAMUSCULAR | Status: DC | PRN
  Administered 2019-12-26: 5 mg via INTRAVENOUS

## 2019-12-26 MED ORDER — DEXMEDETOMIDINE (PRECEDEX) IN NS 20 MCG/5ML (4 MCG/ML) IV SYRINGE
PREFILLED_SYRINGE | INTRAVENOUS | Status: DC | PRN
  Administered 2019-12-26: 20 ug via INTRAVENOUS

## 2019-12-26 MED ORDER — HYDROMORPHONE HCL 1 MG/ML IJ SOLN
INTRAMUSCULAR | Status: AC
  Filled 2019-12-26: qty 0.5

## 2019-12-26 MED ORDER — AMISULPRIDE (ANTIEMETIC) 5 MG/2ML IV SOLN: 10.0000 mg | Freq: Once | INTRAVENOUS | Status: DC | PRN

## 2019-12-26 MED ORDER — DEXMEDETOMIDINE (PRECEDEX) IN NS 20 MCG/5ML (4 MCG/ML) IV SYRINGE
PREFILLED_SYRINGE | INTRAVENOUS | Status: AC
  Filled 2019-12-26: qty 15

## 2019-12-26 MED ORDER — EPHEDRINE 5 MG/ML INJ
INTRAVENOUS | Status: AC
  Filled 2019-12-26: qty 10

## 2019-12-26 MED ORDER — LIDOCAINE HCL (CARDIAC) PF 100 MG/5ML IV SOSY
PREFILLED_SYRINGE | INTRAVENOUS | Status: DC | PRN
  Administered 2019-12-26: 100 mg via INTRAVENOUS

## 2019-12-26 MED ORDER — BUPIVACAINE HCL (PF) 0.5 % IJ SOLN
INTRAMUSCULAR | Status: AC
  Filled 2019-12-26: qty 30

## 2019-12-26 MED ORDER — ACETAMINOPHEN 500 MG PO TABS
ORAL_TABLET | ORAL | Status: AC
  Filled 2019-12-26: qty 2

## 2019-12-26 MED ORDER — DEXAMETHASONE SODIUM PHOSPHATE 10 MG/ML IJ SOLN
INTRAMUSCULAR | Status: DC | PRN
  Administered 2019-12-26: 5 mg via INTRAVENOUS

## 2019-12-26 MED ORDER — MIDAZOLAM HCL 5 MG/5ML IJ SOLN
INTRAMUSCULAR | Status: DC | PRN
  Administered 2019-12-26: 2 mg via INTRAVENOUS

## 2019-12-26 MED ORDER — LIDOCAINE 2% (20 MG/ML) 5 ML SYRINGE
INTRAMUSCULAR | Status: AC
  Filled 2019-12-26: qty 5

## 2019-12-26 MED ORDER — BUPIVACAINE HCL (PF) 0.5 % IJ SOLN
INTRAMUSCULAR | Status: DC | PRN
  Administered 2019-12-26: 10 mL

## 2019-12-26 MED ORDER — PROPOFOL 10 MG/ML IV BOLUS
INTRAVENOUS | Status: DC | PRN
  Administered 2019-12-26: 180 mg via INTRAVENOUS
  Administered 2019-12-26: 20 mg via INTRAVENOUS

## 2019-12-26 MED ORDER — FENTANYL CITRATE (PF) 100 MCG/2ML IJ SOLN
INTRAMUSCULAR | Status: DC | PRN
Start: 2019-12-26 — End: 2019-12-26
  Administered 2019-12-26: 50 ug via INTRAVENOUS
  Administered 2019-12-26: 25 ug via INTRAVENOUS
  Administered 2019-12-26 (×3): 50 ug via INTRAVENOUS
  Administered 2019-12-26: 25 ug via INTRAVENOUS
  Administered 2019-12-26: 50 ug via INTRAVENOUS

## 2019-12-26 MED ORDER — 0.9 % SODIUM CHLORIDE (POUR BTL) OPTIME
TOPICAL | Status: DC | PRN
  Administered 2019-12-26: 1000 mL

## 2019-12-26 MED ORDER — LACTATED RINGERS IV SOLN: INTRAVENOUS | Status: DC

## 2019-12-26 MED ORDER — ONDANSETRON HCL 4 MG/2ML IJ SOLN
INTRAMUSCULAR | Status: DC | PRN
  Administered 2019-12-26: 4 mg via INTRAVENOUS

## 2019-12-26 MED ORDER — OXYCODONE HCL 5 MG PO TABS
ORAL_TABLET | ORAL | Status: AC
  Filled 2019-12-26: qty 1

## 2019-12-26 MED ORDER — ONDANSETRON HCL 4 MG/2ML IJ SOLN
INTRAMUSCULAR | Status: AC
  Filled 2019-12-26: qty 10

## 2019-12-26 MED ORDER — OXYCODONE HCL 5 MG PO TABS: 5.0000 mg | ORAL_TABLET | Freq: Four times a day (QID) | ORAL | 0 refills | Status: DC | PRN

## 2019-12-26 MED ORDER — ACETAMINOPHEN 325 MG PO TABS: 650.0000 mg | ORAL_TABLET | Freq: Four times a day (QID) | ORAL | 0 refills | Status: AC

## 2019-12-26 MED ORDER — OXYCODONE HCL 5 MG/5ML PO SOLN: 5.0000 mg | Freq: Once | ORAL | Status: AC | PRN

## 2019-12-26 MED ORDER — OXYCODONE HCL 5 MG PO TABS
5.0000 mg | ORAL_TABLET | Freq: Once | ORAL | Status: AC | PRN
  Administered 2019-12-26: 5 mg via ORAL

## 2019-12-26 MED ORDER — MEPERIDINE HCL 25 MG/ML IJ SOLN: 6.2500 mg | INTRAMUSCULAR | Status: DC | PRN

## 2019-12-26 MED ORDER — MIDAZOLAM HCL 2 MG/2ML IJ SOLN
INTRAMUSCULAR | Status: AC
  Filled 2019-12-26: qty 2

## 2019-12-26 MED ORDER — PROPOFOL 10 MG/ML IV BOLUS
INTRAVENOUS | Status: AC
  Filled 2019-12-26: qty 40

## 2019-12-26 MED ORDER — PROMETHAZINE HCL 25 MG/ML IJ SOLN: 6.2500 mg | INTRAMUSCULAR | Status: DC | PRN

## 2019-12-26 MED ORDER — PROPOFOL 500 MG/50ML IV EMUL
INTRAVENOUS | Status: AC
  Filled 2019-12-26: qty 100

## 2019-12-26 MED ORDER — ATROPINE SULFATE 0.4 MG/ML IJ SOLN
INTRAMUSCULAR | Status: AC
  Filled 2019-12-26: qty 1

## 2019-12-26 MED ORDER — PROPOFOL 10 MG/ML IV BOLUS
INTRAVENOUS | Status: AC
  Filled 2019-12-26: qty 20

## 2019-12-26 MED ORDER — ACETAMINOPHEN 500 MG PO TABS
1000.0000 mg | ORAL_TABLET | Freq: Once | ORAL | Status: AC
  Administered 2019-12-26: 1000 mg via ORAL

## 2019-12-26 SURGICAL SUPPLY — 88 items
APL PRP STRL LF DISP 70% ISPRP (MISCELLANEOUS) ×1
BAND INSRT 18 STRL LF DISP RB (MISCELLANEOUS)
BAND RUBBER #18 3X1/16 STRL (MISCELLANEOUS) IMPLANT
BIT DRILL 2.2 SS TIBIAL (BIT) ×1 IMPLANT
BLADE HEX COATED 2.75 (ELECTRODE) ×2 IMPLANT
BLADE MINI RND TIP GREEN BEAV (BLADE) IMPLANT
BLADE SURG 15 STRL LF DISP TIS (BLADE) ×1 IMPLANT
BLADE SURG 15 STRL SS (BLADE) ×2
BNDG CMPR 9X4 STRL LF SNTH (GAUZE/BANDAGES/DRESSINGS) ×1
BNDG COHESIVE 2X5 TAN STRL LF (GAUZE/BANDAGES/DRESSINGS) IMPLANT
BNDG COHESIVE 4X5 TAN STRL (GAUZE/BANDAGES/DRESSINGS) ×2 IMPLANT
BNDG ESMARK 4X9 LF (GAUZE/BANDAGES/DRESSINGS) ×2 IMPLANT
BNDG GAUZE ELAST 4 BULKY (GAUZE/BANDAGES/DRESSINGS) ×2 IMPLANT
BRUSH SCRUB EZ PLAIN DRY (MISCELLANEOUS) ×1 IMPLANT
CANISTER SUCT 1200ML W/VALVE (MISCELLANEOUS) ×2 IMPLANT
CHLORAPREP W/TINT 26 (MISCELLANEOUS) ×2 IMPLANT
CORD BIPOLAR FORCEPS 12FT (ELECTRODE) ×2 IMPLANT
COVER BACK TABLE 60X90IN (DRAPES) ×2 IMPLANT
COVER MAYO STAND STRL (DRAPES) ×2 IMPLANT
COVER WAND RF STERILE (DRAPES) IMPLANT
CUFF TOURN SGL QUICK 18X4 (TOURNIQUET CUFF) ×1 IMPLANT
CUFF TOURN SGL QUICK 24 (TOURNIQUET CUFF)
CUFF TRNQT CYL 24X4X16.5-23 (TOURNIQUET CUFF) IMPLANT
DRAPE C-ARM 42X72 X-RAY (DRAPES) ×2 IMPLANT
DRAPE EXTREMITY T 121X128X90 (DISPOSABLE) ×2 IMPLANT
DRAPE SURG 17X23 STRL (DRAPES) ×2 IMPLANT
DRSG ADAPTIC 3X8 NADH LF (GAUZE/BANDAGES/DRESSINGS) ×2 IMPLANT
DRSG EMULSION OIL 3X3 NADH (GAUZE/BANDAGES/DRESSINGS) IMPLANT
ELECT REM PT RETURN 9FT ADLT (ELECTROSURGICAL) ×2
ELECTRODE REM PT RTRN 9FT ADLT (ELECTROSURGICAL) ×1 IMPLANT
GAUZE SPONGE 4X4 12PLY STRL (GAUZE/BANDAGES/DRESSINGS) ×1 IMPLANT
GAUZE SPONGE 4X4 12PLY STRL LF (GAUZE/BANDAGES/DRESSINGS) ×2 IMPLANT
GLOVE BIO SURGEON STRL SZ7.5 (GLOVE) ×2 IMPLANT
GLOVE BIOGEL PI IND STRL 7.0 (GLOVE) ×1 IMPLANT
GLOVE BIOGEL PI IND STRL 8 (GLOVE) ×1 IMPLANT
GLOVE BIOGEL PI INDICATOR 7.0 (GLOVE) ×2
GLOVE BIOGEL PI INDICATOR 8 (GLOVE) ×2
GLOVE ECLIPSE 6.5 STRL STRAW (GLOVE) ×2 IMPLANT
GOWN STRL REUS W/ TWL LRG LVL3 (GOWN DISPOSABLE) ×2 IMPLANT
GOWN STRL REUS W/TWL LRG LVL3 (GOWN DISPOSABLE) ×4
GOWN STRL REUS W/TWL XL LVL3 (GOWN DISPOSABLE) ×2 IMPLANT
GRAFT NERVE AVANCE 2-3X30 (Nerve Graft) ×1 IMPLANT
K-WIRE 1.6 (WIRE) ×6
K-WIRE FX5X1.6XNS BN SS (WIRE) ×3
KWIRE FX5X1.6XNS BN SS (WIRE) IMPLANT
NDL HYPO 25X1 1.5 SAFETY (NEEDLE) IMPLANT
NEEDLE HYPO 25X1 1.5 SAFETY (NEEDLE) IMPLANT
NS IRRIG 1000ML POUR BTL (IV SOLUTION) ×2 IMPLANT
PACK BASIN DAY SURGERY FS (CUSTOM PROCEDURE TRAY) ×2 IMPLANT
PAD CAST 4YDX4 CTTN HI CHSV (CAST SUPPLIES) IMPLANT
PADDING CAST ABS 4INX4YD NS (CAST SUPPLIES)
PADDING CAST ABS COTTON 4X4 ST (CAST SUPPLIES) IMPLANT
PADDING CAST COTTON 4X4 STRL (CAST SUPPLIES) ×2
PEG LOCKING SMOOTH 2.2X16 (Screw) ×1 IMPLANT
PEG LOCKING SMOOTH 2.2X22 (Screw) ×1 IMPLANT
PEG LOCKING SMOOTH 2.2X24 (Peg) ×1 IMPLANT
PENCIL SMOKE EVACUATOR (MISCELLANEOUS) ×2 IMPLANT
PLATE LONG DVR RIGHT (Plate) ×1 IMPLANT
PUTTY DBM STAGRAFT 2CC (Putty) ×1 IMPLANT
SCREW LOCK 16X2.7X 3 LD TPR (Screw) IMPLANT
SCREW LOCK 18X2.7X 3 LD TPR (Screw) IMPLANT
SCREW LOCK 20X2.7X 3 LD TPR (Screw) IMPLANT
SCREW LOCK 22X2.7X 3 LD TPR (Screw) IMPLANT
SCREW LOCKING 2.7X16 (Screw) ×4 IMPLANT
SCREW LOCKING 2.7X18 (Screw) ×6 IMPLANT
SCREW LOCKING 2.7X20MM (Screw) ×4 IMPLANT
SCREW LOCKING 2.7X22MM (Screw) ×2 IMPLANT
SCREW LP NL 2.7X22MM (Screw) ×1 IMPLANT
SCREW NLOCK 24X2.7 3 LD (Screw) IMPLANT
SCREW NONLOCK 2.7X24 (Screw) ×2 IMPLANT
SLEEVE SCD COMPRESS KNEE MED (MISCELLANEOUS) ×2 IMPLANT
SLING ARM FOAM STRAP LRG (SOFTGOODS) IMPLANT
SPLINT PLASTER CAST XFAST 3X15 (CAST SUPPLIES) IMPLANT
SPLINT PLASTER XTRA FASTSET 3X (CAST SUPPLIES)
STOCKINETTE 6  STRL (DRAPES) ×2
STOCKINETTE 6 STRL (DRAPES) ×1 IMPLANT
SUCTION FRAZIER HANDLE 10FR (MISCELLANEOUS) ×2
SUCTION TUBE FRAZIER 10FR DISP (MISCELLANEOUS) ×1 IMPLANT
SUT ETHILON 8 0 BV130 4 (SUTURE) ×2 IMPLANT
SUT VIC AB 2-0 PS2 27 (SUTURE) ×3 IMPLANT
SUT VICRYL 4-0 PS2 18IN ABS (SUTURE) IMPLANT
SUT VICRYL RAPIDE 4-0 (SUTURE) IMPLANT
SUT VICRYL RAPIDE 4/0 PS 2 (SUTURE) ×3 IMPLANT
SYR 10ML LL (SYRINGE) IMPLANT
SYR BULB EAR ULCER 3OZ GRN STR (SYRINGE) ×2 IMPLANT
TOWEL GREEN STERILE FF (TOWEL DISPOSABLE) ×2 IMPLANT
TUBE CONNECTING 20X1/4 (TUBING) ×2 IMPLANT
UNDERPAD 30X36 HEAVY ABSORB (UNDERPADS AND DIAPERS) ×2 IMPLANT
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
## 2019-12-26 NOTE — Transfer of Care (Signed)
Immediate Anesthesia Transfer of Care Note  Patient: Ricardo Wheeler  Procedure(s) Performed: OPEN TREATMENT OF RIGHT DISTAL RADIUS FRACTURE AND MEDIAN NERVE REPAIR (Right Arm Lower)  Patient Location: PACU  Anesthesia Type:General  Level of Consciousness: awake and drowsy  Airway & Oxygen Therapy: Patient Spontanous Breathing and Patient connected to face mask oxygen  Post-op Assessment: Report given to RN and Post -op Vital signs reviewed and stable  Post vital signs: Reviewed and stable  Last Vitals:  Vitals Value Taken Time  BP 143/82 12/26/19 1345  Temp    Pulse 98 12/26/19 1345  Resp 14 12/26/19 1345  SpO2 100 % 12/26/19 1345  Vitals shown include unvalidated device data.  Last Pain:  Vitals:   12/26/19 0950  TempSrc: Oral  PainSc: 7       Patients Stated Pain Goal: 7 (12/26/19 0950)  Complications: No complications documented.
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
## 2019-12-26 NOTE — Discharge Instructions (Signed)
Discharge Instructions   You have a dressing with a plaster splint incorporated in it. Move your fingers as much as possible, making a full fist and fully opening the fist. Elevate your hand to reduce pain & swelling of the digits.  Ice over the operative site or in your arm pit may be helpful to reduce pain & swelling.  DO NOT USE HEAT. Pain medicine has been prescribed for you.  Take Tylenol 650 mg and ibuprofen 600 mg TOGETHER every 6 hours. Take the Oxycodone only as a rescue medicine for severe post operative pain that cannot be managed by the OTC medicines. Leave the dressing in place until you return to our office.  You may shower, but keep the bandage clean & dry.  You may drive a car when you are off of prescription pain medications and can safely control your vehicle with both hands. Our office will call you to arrange follow-up   Please call [phone] during normal business hours or [phone] after hours for any problems. Including the following:  - excessive redness of the incisions - drainage for more than 4 days - fever of more than 101.5 F  *Please note that pain medications will not be refilled after hours or on weekends.  WORK STATUS: Must keep splint on, clean, and dry until first post operative appointment. No lifting, gripping or grasping greater than pencil and paper tasks.     Post Anesthesia Home Care Instructions  Activity: Get plenty of rest for the remainder of the day. A responsible individual must stay with you for 24 hours following the procedure.  For the next 24 hours, DO NOT: -Drive a car -Advertising copywriter -Drink alcoholic beverages -Take any medication unless instructed by your physician -Make any legal decisions or sign important papers.  Meals: Start with liquid foods such as gelatin or soup. Progress to regular foods as tolerated. Avoid greasy, spicy, heavy foods. If nausea and/or vomiting occur, drink only clear liquids until the  nausea and/or vomiting subsides. Call your physician if vomiting continues.  Special Instructions/Symptoms: Your throat may feel dry or sore from the anesthesia or the breathing tube placed in your throat during surgery. If this causes discomfort, gargle with warm salt water. The discomfort should disappear within 24 hours.    Next dose of Tylenol can be given at 4:00pm if needed.

## 2019-12-26 NOTE — Anesthesia Postprocedure Evaluation (Signed)
Anesthesia Post Note  Patient: Ricardo Wheeler  Procedure(s) Performed: OPEN TREATMENT OF RIGHT DISTAL RADIUS FRACTURE AND MEDIAN NERVE REPAIR (Right Arm Lower)     Patient location during evaluation: PACU Anesthesia Type: General Level of consciousness: awake and alert Pain management: pain level controlled Vital Signs Assessment: post-procedure vital signs reviewed and stable Respiratory status: spontaneous breathing, nonlabored ventilation, respiratory function stable and patient connected to nasal cannula oxygen Cardiovascular status: blood pressure returned to baseline and stable Postop Assessment: no apparent nausea or vomiting Anesthetic complications: no   No complications documented.  Last Vitals:  Vitals:   12/26/19 1430 12/26/19 1454  BP: 139/82 120/71  Pulse: 70 73  Resp: 12 16  Temp:  36.6 C  SpO2: 99% 100%    Last Pain:  Vitals:   12/26/19 1454  TempSrc:   PainSc: 4                  Trevor Iha

## 2019-12-26 NOTE — Anesthesia Procedure Notes (Signed)
Procedure Name: LMA Insertion Date/Time: 12/26/2019 11:31 AM Performed by: Lauralyn Primes, CRNA Pre-anesthesia Checklist: Patient identified, Emergency Drugs available, Suction available and Patient being monitored Patient Re-evaluated:Patient Re-evaluated prior to induction Oxygen Delivery Method: Circle system utilized Preoxygenation: Pre-oxygenation with 100% oxygen Induction Type: IV induction Ventilation: Mask ventilation without difficulty LMA: LMA inserted LMA Size: 5.0 Number of attempts: 1 Airway Equipment and Method: Bite block Placement Confirmation: positive ETCO2 Tube secured with: Tape Dental Injury: Teeth and Oropharynx as per pre-operative assessment

## 2019-12-27 NOTE — Addendum Note (Signed)
Addendum  created 12/27/19 1021 by Lance Coon, CRNA   Charge Capture section accepted

## 2019-12-27 NOTE — Addendum Note (Signed)
Addendum  created 12/27/19 1020 by Lauralyn Primes, CRNA   Charge Capture section accepted

## 2020-01-02 ENCOUNTER — Encounter (HOSPITAL_BASED_OUTPATIENT_CLINIC_OR_DEPARTMENT_OTHER): Payer: Self-pay | Admitting: Orthopedic Surgery

## 2020-01-23 ENCOUNTER — Other Ambulatory Visit: Payer: Self-pay

## 2020-01-23 ENCOUNTER — Ambulatory Visit: Payer: Self-pay | Attending: Orthopedic Surgery | Admitting: Occupational Therapy

## 2020-01-23 DIAGNOSIS — R600 Localized edema: Secondary | ICD-10-CM | POA: Insufficient documentation

## 2020-01-23 DIAGNOSIS — M25531 Pain in right wrist: Secondary | ICD-10-CM | POA: Insufficient documentation

## 2020-01-23 DIAGNOSIS — M25631 Stiffness of right wrist, not elsewhere classified: Secondary | ICD-10-CM | POA: Insufficient documentation

## 2020-01-23 DIAGNOSIS — R278 Other lack of coordination: Secondary | ICD-10-CM | POA: Insufficient documentation

## 2020-01-23 DIAGNOSIS — M25641 Stiffness of right hand, not elsewhere classified: Secondary | ICD-10-CM | POA: Insufficient documentation

## 2020-01-23 DIAGNOSIS — M6281 Muscle weakness (generalized): Secondary | ICD-10-CM | POA: Insufficient documentation
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
## 2020-01-23 NOTE — Patient Instructions (Signed)
WEARING SCHEDULE:  Wear splint at ALL times except for hygiene care and exercises (May remove splint for exercises and then immediately place back on ONLY if directed by the therapist)  PURPOSE:  To prevent movement and for protection until injury can heal  CARE OF SPLINT:  Keep splint away from heat sources including: stove, radiator or furnace, or a car in sunlight. The splint can melt and will no longer fit you properly  Keep away from pets and children  Clean the splint with rubbing alcohol 1-2 times per day.  * During this time, make sure you also clean your hand/arm as instructed by your therapist and/or perform dressing changes as needed. Then dry hand/arm completely before replacing splint. (When cleaning hand/arm, keep it immobilized in same position until splint is replaced)  PRECAUTIONS/POTENTIAL PROBLEMS: *If you notice or experience increased pain, swelling, numbness, or a lingering reddened area from the splint: Contact your therapist immediately by calling [phone]. You must wear the splint for protection, but we will get you scheduled for adjustments as quickly as possible.  (If only straps or hooks need to be replaced and NO adjustments to the splint need to be made, just call the office ahead and let them know you are coming in)  If you have any medical concerns or signs of infection, please call your doctor immediately   SCAR MASSAGE:  Use cocoa butter or vitamin E cream and apply gentle pressure along incision of scar in circular motion x 5 minutes, 2x/day. Increase pressure as tolerated.    AROM: Wrist Extension   .  With __Rt__ palm down, bend wrist up. Repeat __15__ times per set.  Do __6__ sessions per day.    AROM: Wrist Flexion   With__Rt___ palm up, bend wrist up. Repeat __15__ times per set.  Do _6___ sessions per day.   AROM: Forearm Pronation / Supination   With __Rt__ arm bent at elbow, slowly rotate palm down until stretch is felt. Relax.  Then rotate palm up until stretch is felt. Repeat _15___ times per set. Do _6___ sessions per day.  AROM: Wrist Radial / Ulnar Deviation    Gently bend left wrist from side to side as far as possible. FOCUS ON GOING TOWARDS THUMB SIDE Repeat __15__ times per set. Do __6__ sessions per day.  Extension (Passive)    Using other hand, lift hand at wrist GENTLY as far as possible. Hold __10__ seconds. Repeat __5__ times. Do __6__ sessions per day.   Flexor Tendon Gliding (Active Hook Fist)   With fingers and knuckles straight, bend middle and tip joints. Do not bend large knuckles. Repeat _10-15___ times. Do _4-6___ sessions per day.  MP Flexion (Active)   With back of hand on table, bend large knuckles as far as they will go, keeping small joints straight. Repeat _10-15___ times. Do __4-6__ sessions per day.     Finger Flexion / Extension   Bend fingers of RIGHT hand toward palm, making a  fist. Straighten fingers, opening fist. Repeat sequence _10-15___ times per session. Do _4-6__ sessions per day.   Opposition (Active)   Touch tip of thumb to nail tip of each finger in turn, making an "O" shape. Repeat __10__ times. Do _4-6___ sessions per day.   MP Flexion (Active)   Bend thumb to touch base of little finger, keeping tip joint straight. Repeat __10-15__ times. Do _4-6___ sessions per day.   Composite Extension (Active)   Bring thumb up and out in hitchhiker position.  Repeat __10-15__ times. Do _4-6___ sessions per day.

## 2020-01-26 ENCOUNTER — Ambulatory Visit: Payer: Self-pay | Admitting: *Deleted

## 2020-02-03 ENCOUNTER — Ambulatory Visit: Payer: Self-pay | Admitting: Occupational Therapy

## 2020-02-03 ENCOUNTER — Other Ambulatory Visit: Payer: Self-pay

## 2020-02-03 DIAGNOSIS — R600 Localized edema: Secondary | ICD-10-CM

## 2020-02-03 DIAGNOSIS — R278 Other lack of coordination: Secondary | ICD-10-CM

## 2020-02-03 DIAGNOSIS — M6281 Muscle weakness (generalized): Secondary | ICD-10-CM

## 2020-02-03 DIAGNOSIS — M25641 Stiffness of right hand, not elsewhere classified: Secondary | ICD-10-CM

## 2020-02-03 DIAGNOSIS — M25631 Stiffness of right wrist, not elsewhere classified: Secondary | ICD-10-CM

## 2020-02-03 DIAGNOSIS — M25531 Pain in right wrist: Secondary | ICD-10-CM
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
## 2020-02-08 ENCOUNTER — Ambulatory Visit: Payer: Medicaid Other | Admitting: Occupational Therapy

## 2020-02-15 ENCOUNTER — Ambulatory Visit: Payer: Self-pay | Attending: Orthopedic Surgery | Admitting: Occupational Therapy

## 2020-02-22 ENCOUNTER — Ambulatory Visit: Payer: Self-pay | Admitting: Occupational Therapy

## 2020-02-22 ENCOUNTER — Telehealth: Payer: Self-pay

## 2020-02-22 NOTE — Telephone Encounter (Signed)
Called and left message with patient re: no show for last 3 appointments. Pt was reminded of next appointment on 02/29/20 and that he would be d/c if he no shows.

## 2020-02-29 ENCOUNTER — Ambulatory Visit: Payer: Medicaid Other | Admitting: Occupational Therapy

## 2020-03-06 ENCOUNTER — Encounter: Payer: Medicaid Other | Admitting: *Deleted

## 2020-11-08 ENCOUNTER — Encounter (HOSPITAL_BASED_OUTPATIENT_CLINIC_OR_DEPARTMENT_OTHER): Payer: Self-pay | Admitting: Orthopedic Surgery

## 2021-12-17 ENCOUNTER — Encounter (HOSPITAL_BASED_OUTPATIENT_CLINIC_OR_DEPARTMENT_OTHER): Payer: Self-pay | Admitting: Orthopedic Surgery

## 2022-02-11 ENCOUNTER — Emergency Department (HOSPITAL_BASED_OUTPATIENT_CLINIC_OR_DEPARTMENT_OTHER): Payer: Self-pay | Admitting: Radiology

## 2022-02-11 ENCOUNTER — Emergency Department (HOSPITAL_BASED_OUTPATIENT_CLINIC_OR_DEPARTMENT_OTHER)
Admission: EM | Admit: 2022-02-11 | Discharge: 2022-02-11 | Disposition: A | Discharge status: Home / Self Care | Payer: Self-pay | Attending: Emergency Medicine | Admitting: Emergency Medicine

## 2022-02-11 ENCOUNTER — Other Ambulatory Visit: Payer: Self-pay

## 2022-02-11 ENCOUNTER — Encounter (HOSPITAL_BASED_OUTPATIENT_CLINIC_OR_DEPARTMENT_OTHER): Payer: Self-pay

## 2022-02-11 DIAGNOSIS — S93402A Sprain of unspecified ligament of left ankle, initial encounter: Secondary | ICD-10-CM | POA: Insufficient documentation

## 2022-02-11 DIAGNOSIS — X500XXA Overexertion from strenuous movement or load, initial encounter: Secondary | ICD-10-CM | POA: Insufficient documentation

## 2022-02-11 DIAGNOSIS — Y990 Civilian activity done for income or pay: Secondary | ICD-10-CM | POA: Insufficient documentation

## 2022-02-11 MED ORDER — IBUPROFEN 800 MG PO TABS: 800.0000 mg | ORAL_TABLET | Freq: Four times a day (QID) | ORAL | 0 refills | Status: AC | PRN

## 2022-02-11 NOTE — ED Triage Notes (Signed)
Presents with left ankle pain and swelling onset yesterday at work. Missed a step while moving furniture. Motor and sensation intact.

## 2022-02-11 NOTE — ED Notes (Signed)
Discharge paperwork given and verbally understood. 

## 2022-02-11 NOTE — ED Provider Notes (Signed)
  [facility] Provider Note   CSN: [id number] Arrival date & time: 02/11/22  1140     History  Chief Complaint  Patient presents with   Ankle Pain    [name] is a 31 y.o. male.  Presents to the emergency department for evaluation of left ankle injury.  Patient reports that he twisted his ankle yesterday.  Patient complaining of pain and swelling on the outside portion of the ankle.       Home Medications Prior to Admission medications   Medication Sig Start Date End Date Taking? Authorizing Provider  ibuprofen (ADVIL) 800 MG tablet Take 1 tablet (800 mg total) by mouth every 6 (six) hours as needed for moderate pain. 02/11/22  Yes Kaydynce Pat, Gwenyth Allegra, MD      Allergies    Patient has no known allergies.    Review of Systems   Review of Systems  Physical Exam Updated Vital Signs BP (!) 149/78 (BP Location: Right Arm)   Pulse 70   Temp 97.7 F (36.5 C) (Oral)   Resp 16   Ht 5\' 11"  (1.803 m)   Wt 79.4 kg   SpO2 99%   BMI 24.41 kg/m  Physical Exam Constitutional:      Appearance: Normal appearance.  HENT:     Head: Atraumatic.  Musculoskeletal:     Left knee: Normal.     Left ankle: Swelling present. No deformity or ecchymosis. Tenderness present over the lateral malleolus. No base of 5th metatarsal or proximal fibula tenderness. Decreased range of motion.  Neurological:     Mental Status: He is alert.     ED Results / Procedures / Treatments   Labs (all labs ordered are listed, but only abnormal results are displayed) Labs Reviewed - No data to display  EKG None  Radiology DG Ankle Complete Left  Result Date: 02/11/2022 CLINICAL DATA:  Pain and swelling and lateral aspect of left ankle after missing a step. EXAM: LEFT ANKLE COMPLETE - 3+ VIEW COMPARISON:  02/05/2017 FINDINGS: Previous intramedullary rod and screw fixation of the right tibia. No signs of acute fracture or dislocation. Lateral soft tissue  swelling noted. IMPRESSION: 1. Lateral soft tissue swelling. 2. No acute bone abnormality. Electronically Signed   By: Kerby Moors M.D.   On: 02/11/2022 12:15    Procedures Procedures    Medications Ordered in ED Medications - No data to display  ED Course/ Medical Decision Making/ A&P                           Medical Decision Making Amount and/or Complexity of Data Reviewed Radiology: ordered and independent interpretation performed. Decision-making details documented in ED Course.   Presents with complaints of left ankle injury.  Patient twisted his ankle yesterday, has swelling on the lateral malleolus.  X-ray negative.        Final Clinical Impression(s) / ED Diagnoses Final diagnoses:  Sprain of left ankle, unspecified ligament, initial encounter    Rx / DC Orders ED Discharge Orders          Ordered    ibuprofen (ADVIL) 800 MG tablet  Every 6 hours PRN        02/11/22 1220              Orpah Greek, MD 02/11/22 1220

## 2022-02-11 NOTE — ED Notes (Signed)
Ice was offered for the ankle. Pt refused.
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
## 2023-03-29 ENCOUNTER — Emergency Department (HOSPITAL_COMMUNITY)
Admission: EM | Admit: 2023-03-29 | Discharge: 2023-03-30 | Disposition: A | Discharge status: Home / Self Care | Payer: Medicaid Other | Attending: Emergency Medicine | Admitting: Emergency Medicine

## 2023-03-29 DIAGNOSIS — Z202 Contact with and (suspected) exposure to infections with a predominantly sexual mode of transmission: Secondary | ICD-10-CM | POA: Insufficient documentation

## 2023-03-30 ENCOUNTER — Other Ambulatory Visit: Payer: Self-pay

## 2023-03-30 ENCOUNTER — Encounter (HOSPITAL_COMMUNITY): Payer: Self-pay | Admitting: *Deleted

## 2023-03-30 MED ORDER — LIDOCAINE HCL (PF) 1 % IJ SOLN
1.0000 mL | Freq: Once | INTRAMUSCULAR | Status: AC
  Administered 2023-03-30: 1 mL
  Filled 2023-03-30: qty 5

## 2023-03-30 MED ORDER — DOXYCYCLINE HYCLATE 100 MG PO TABS: 100.0000 mg | ORAL_TABLET | Freq: Two times a day (BID) | ORAL | 0 refills | Status: AC

## 2023-03-30 MED ORDER — CEFTRIAXONE SODIUM 500 MG IJ SOLR
500.0000 mg | Freq: Once | INTRAMUSCULAR | Status: AC
  Administered 2023-03-30: 500 mg via INTRAMUSCULAR
  Filled 2023-03-30: qty 500

## 2023-03-30 MED ORDER — DOXYCYCLINE HYCLATE 100 MG PO TABS: 100.0000 mg | ORAL_TABLET | Freq: Two times a day (BID) | ORAL | 0 refills | Status: DC

## 2023-03-30 NOTE — ED Notes (Signed)
Pt refused to be connected to monitor for vitals

## 2023-03-30 NOTE — ED Provider Notes (Signed)
[place] EMERGENCY DEPARTMENT AT [facility] Provider Note   CSN: [id number] Arrival date & time: 03/29/23  2339     History  Chief Complaint  Patient presents with   Exposure to STD    [facility] is a 32 y.o. male.  Patient presents to the emergency department requesting treatment for STD exposure.  Patient states his partner told him she tested positive for gonorrhea/chlamydia and that he needed to go get "the shot and the pill".  He denies any symptoms at this time.   Exposure to STD       Home Medications Prior to Admission medications   Medication Sig Start Date End Date Taking? Authorizing Provider  doxycycline (VIBRA-TABS) 100 MG tablet Take 1 tablet (100 mg total) by mouth 2 (two) times daily. 03/30/23  Yes Darrick Grinder, PA-C  ibuprofen (ADVIL) 800 MG tablet Take 1 tablet (800 mg total) by mouth every 6 (six) hours as needed for moderate pain. 02/11/22   Gilda Crease, MD      Allergies    Patient has no known allergies.    Review of Systems   Review of Systems  Physical Exam Updated Vital Signs BP (!) 140/86 (BP Location: Right Arm)   Pulse (!) 50   Temp 98.2 F (36.8 C) (Oral)   Resp 15   Ht 5\' 11"  (1.803 m)   Wt 79.4 kg   SpO2 100%   BMI 24.41 kg/m  Physical Exam Vitals and nursing note reviewed.  HENT:     Head: Normocephalic and atraumatic.  Pulmonary:     Effort: Pulmonary effort is normal. No respiratory distress.  Genitourinary:    Comments: Deferred Musculoskeletal:        General: No signs of injury.     Cervical back: Normal range of motion.  Skin:    General: Skin is dry.  Neurological:     Mental Status: He is alert.  Psychiatric:        Speech: Speech normal.        Behavior: Behavior normal.     ED Results / Procedures / Treatments   Labs (all labs ordered are listed, but only abnormal results are displayed) Labs Reviewed  GC/CHLAMYDIA PROBE AMP ([place]) NOT AT [facility]     EKG None  Radiology No results found.  Procedures Procedures    Medications Ordered in ED Medications  cefTRIAXone (ROCEPHIN) injection 500 mg (has no administration in time range)  lidocaine (PF) (XYLOCAINE) 1 % injection 1-2.1 mL (has no administration in time range)    ED Course/ Medical Decision Making/ A&P                                 Medical Decision Making  Patient presents to the emergency room requesting STD treatment.  Urine sample sent for gonorrhea/chlamydia testing.  Patient treated with Rocephin based on known exposure.  Prescription for doxycycline sent to emergency department.  Patient informed that he should refrain from any sexual activity until he has finished all antibiotics.  Patient declined HIV/RPR testing        Final Clinical Impression(s) / ED Diagnoses Final diagnoses:  STD exposure    Rx / DC Orders ED Discharge Orders          Ordered    doxycycline (VIBRA-TABS) 100 MG tablet  2 times daily        03/30/23 0506  Darrick Grinder, PA-C 03/30/23 1610    Zadie Rhine, MD 03/30/23 [phone]
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
## 2023-03-30 NOTE — Discharge Instructions (Signed)
Please complete the prescribed course of antibiotics.  Refrain from sexual activity until your antibiotics are complete.

## 2023-03-31 LAB — GC/CHLAMYDIA PROBE AMP (~~LOC~~) NOT AT ARMC
Chlamydia: NEGATIVE
Comment: NEGATIVE
Comment: NORMAL
Neisseria Gonorrhea: NEGATIVE
